# Patient Record
Sex: Male | Born: 1975 | Race: Black or African American | Hispanic: No | Marital: Single | Smoking: Current every day smoker
Health system: Southern US, Community
[De-identification: ages and names within clinical notes are randomized; demographics above are authoritative.]

## PROBLEM LIST (undated history)

## (undated) DIAGNOSIS — Z789 Other specified health status: Secondary | ICD-10-CM

## (undated) HISTORY — PX: KNEE SURGERY: SHX244

## (undated) HISTORY — PX: HEMORRHOID SURGERY: SHX153

---

## 2003-12-03 ENCOUNTER — Observation Stay (HOSPITAL_COMMUNITY): Admission: EM | Admit: 2003-12-03 | Discharge: 2003-12-04 | Payer: Self-pay | Admitting: Emergency Medicine

## 2003-12-05 ENCOUNTER — Emergency Department (HOSPITAL_COMMUNITY): Admission: EM | Admit: 2003-12-05 | Discharge: 2003-12-05 | Payer: Self-pay | Admitting: Emergency Medicine

## 2004-08-29 ENCOUNTER — Emergency Department: Payer: Self-pay | Admitting: Emergency Medicine

## 2004-09-02 ENCOUNTER — Emergency Department: Payer: Self-pay | Admitting: Emergency Medicine

## 2004-09-03 ENCOUNTER — Emergency Department: Payer: Self-pay | Admitting: Emergency Medicine

## 2004-09-10 ENCOUNTER — Emergency Department: Payer: Self-pay | Admitting: Emergency Medicine

## 2005-09-30 ENCOUNTER — Emergency Department (HOSPITAL_COMMUNITY): Admission: EM | Admit: 2005-09-30 | Discharge: 2005-10-01 | Payer: Self-pay | Admitting: Emergency Medicine

## 2006-01-09 ENCOUNTER — Emergency Department (HOSPITAL_COMMUNITY): Admission: EM | Admit: 2006-01-09 | Discharge: 2006-01-09 | Payer: Self-pay | Admitting: Emergency Medicine

## 2006-02-25 ENCOUNTER — Emergency Department (HOSPITAL_COMMUNITY): Admission: EM | Admit: 2006-02-25 | Discharge: 2006-02-26 | Payer: Self-pay | Admitting: Emergency Medicine

## 2007-09-27 ENCOUNTER — Emergency Department: Payer: Self-pay | Admitting: Unknown Physician Specialty

## 2010-05-07 ENCOUNTER — Emergency Department: Payer: Self-pay | Admitting: Emergency Medicine

## 2010-05-16 ENCOUNTER — Emergency Department: Payer: Self-pay | Admitting: Internal Medicine

## 2010-07-26 ENCOUNTER — Emergency Department: Payer: Self-pay | Admitting: Emergency Medicine

## 2010-07-28 ENCOUNTER — Emergency Department: Payer: Self-pay | Admitting: Unknown Physician Specialty

## 2010-08-24 ENCOUNTER — Emergency Department: Payer: Self-pay | Admitting: Emergency Medicine

## 2010-10-30 ENCOUNTER — Emergency Department: Payer: Self-pay | Admitting: Internal Medicine

## 2010-11-27 ENCOUNTER — Emergency Department: Payer: Self-pay | Admitting: Emergency Medicine

## 2011-12-18 ENCOUNTER — Ambulatory Visit: Payer: Self-pay | Admitting: Ophthalmology

## 2019-08-10 ENCOUNTER — Emergency Department
Admission: EM | Admit: 2019-08-10 | Discharge: 2019-08-10 | Disposition: A | Discharge status: Home / Self Care | Payer: Self-pay | Attending: Emergency Medicine | Admitting: Emergency Medicine

## 2019-08-10 ENCOUNTER — Other Ambulatory Visit: Payer: Self-pay

## 2019-08-10 ENCOUNTER — Encounter: Payer: Self-pay | Admitting: *Deleted

## 2019-08-10 DIAGNOSIS — J302 Other seasonal allergic rhinitis: Secondary | ICD-10-CM | POA: Insufficient documentation

## 2019-08-10 DIAGNOSIS — F17200 Nicotine dependence, unspecified, uncomplicated: Secondary | ICD-10-CM | POA: Insufficient documentation

## 2019-08-10 DIAGNOSIS — K649 Unspecified hemorrhoids: Secondary | ICD-10-CM | POA: Insufficient documentation

## 2019-08-10 MED ORDER — FLUTICASONE FUROATE 27.5 MCG/SPRAY NA SUSP: 2.0000 | Freq: Every day | NASAL | 2 refills | Status: DC

## 2019-08-10 MED ORDER — HYDROCORTISONE (PERIANAL) 2.5 % EX CREA: 1.0000 "application " | TOPICAL_CREAM | Freq: Two times a day (BID) | CUTANEOUS | 0 refills | Status: DC

## 2019-08-10 MED ORDER — PSEUDOEPHEDRINE HCL 30 MG PO TABS: 30.0000 mg | ORAL_TABLET | Freq: Four times a day (QID) | ORAL | 2 refills | Status: DC | PRN

## 2019-08-10 NOTE — ED Notes (Addendum)
Pt reports pain 7/10 when sitting due to hemorrhoid, states "size of a golf ball". Reports no bleeding. Pt also reports "really bad allergies" lately and would like this checked out.

## 2019-08-10 NOTE — ED Provider Notes (Signed)
[facility] Emergency Department Provider Note  ____________________________________________  Time seen: Approximately 5:25 PM  I have reviewed the triage vital signs and the nursing notes.   HISTORY  Chief Complaint Hemorrhoids   HPI Scott Smith is a 43 y.o. male presenting to the emergency department for treatment and evaluation of  hemorrhoids and seasonal allergies. Patient states that he has dealt with both for "years."   Hemorrhoids have worsened over the past 2-3 days. He denies bleeding. No relief with suppositories, Epson Salt soak, and witch hazel.   Allergy symptoms have worsened over the past few days as well. No relief with "nose drops" and allergy tablets. He denies fever or cough.  No past medical history on file.  There are no active problems to display for this patient.  Prior to Admission medications   Medication Sig Start Date End Date Taking? Authorizing Provider  fluticasone (VERAMYST) 27.5 MCG/SPRAY nasal spray Place 2 sprays into the nose daily. 08/10/19   Khyson Sebesta, Johnette Abraham B, FNP  hydrocortisone (ANUSOL-HC) 2.5 % rectal cream Place 1 application rectally 2 (two) times daily. 08/10/19   Lenvil Swaim B, FNP  pseudoephedrine (SUDAFED) 30 MG tablet Take 1 tablet (30 mg total) by mouth every 6 (six) hours as needed for congestion. 08/10/19 08/09/20  Victorino Dike, FNP    Allergies Patient has no known allergies.  No family history on file.  Social History Social History   Tobacco Use  . Smoking status: Current Every Day Smoker  . Smokeless tobacco: Never Used  Substance Use Topics  . Alcohol use: Not Currently  . Drug use: Not Currently    Review of Systems  Constitutional: Negative for fever. HEENT: Positive for rhinorrhea, watery eyes, and sneezing Respiratory: Negative for cough or shortness of breath.  Musculoskeletal: Negative for myalgias Skin: Positive for hemorrhoids. Neurological: Negative for numbness or  paresthesias. ____________________________________________   PHYSICAL EXAM:  VITAL SIGNS: ED Triage Vitals  Enc Vitals Group     BP 08/10/19 1715 135/77     Pulse Rate 08/10/19 1715 85     Resp 08/10/19 1715 18     Temp 08/10/19 1715 98.7 F (37.1 C)     Temp Source 08/10/19 1715 Oral     SpO2 08/10/19 1715 97 %     Weight 08/10/19 1716 150 lb (68 kg)     Height 08/10/19 1716 5\' 6"  (1.676 m)     Head Circumference --      Peak Flow --      Pain Score 08/10/19 1715 8     Pain Loc --      Pain Edu? --      Excl. in [place]? --      Constitutional: Well appearing. Eyes: Conjunctivae are clear without discharge or drainage. Nose: Clear rhinorrhea noted. Nasal mucosa injected and bulging Mouth/Throat: Airway is patent.  Neck: No stridor. Unrestricted range of motion observed. Cardiovascular: Capillary refill is <3 seconds.  Respiratory: Respirations are even and unlabored.. Musculoskeletal: Unrestricted range of motion observed. Neurologic: Awake, alert, and oriented x 4.  Skin: Large hemorrhoid without thrombosis or bleeding.  ____________________________________________   LABS (all labs ordered are listed, but only abnormal results are displayed)  Labs Reviewed - No data to display ____________________________________________  EKG  Not indicated. ____________________________________________  RADIOLOGY  Not indicated. ____________________________________________   PROCEDURES  Procedures ____________________________________________   INITIAL IMPRESSION / ASSESSMENT AND PLAN / ED COURSE  Scott Smith is a 43 y.o. male presenting to  the emergency department for treatment and evaluation of seasonal allergies and hemorrhoids. Exam does show a large hemorrhoid, but no thrombosis or infection. He will be treated with Anusol 2.5% and fluticasone plus pseudoephedrine. He is to call Dr. America Brown office to request an appointment for surgical evaluation of hemorrhoids.  He will follow up with PCP or ENT for allergy symptoms not relieve by medications.  Medications - No data to display   Pertinent labs & imaging results that were available during my care of the patient were reviewed by me and considered in my medical decision making (see chart for details).  ____________________________________________   FINAL CLINICAL IMPRESSION(S) / ED DIAGNOSES  Final diagnoses:  Hemorrhoids, unspecified hemorrhoid type  Seasonal allergies    ED Discharge Orders         Ordered    hydrocortisone (ANUSOL-HC) 2.5 % rectal cream  2 times daily     08/10/19 1804    fluticasone (VERAMYST) 27.5 MCG/SPRAY nasal spray  Daily,   Status:  Discontinued     08/10/19 1804    pseudoephedrine (SUDAFED) 30 MG tablet  Every 6 hours PRN     08/10/19 1804    fluticasone (VERAMYST) 27.5 MCG/SPRAY nasal spray  Daily     08/10/19 1809           Note:  This document was prepared using Dragon voice recognition software and may include unintentional dictation errors.   Chinita Pester, FNP 08/10/19 1811    Concha Se, MD 08/11/19 [phone]

## 2019-08-10 NOTE — Discharge Instructions (Signed)
Please call the surgeon's office in the morning to request an appointment.  Take medications as prescribed.  If allergy symptoms do not improve, please see primary care or schedule an appointment with the ENT specialist of your choice.

## 2019-08-10 NOTE — ED Triage Notes (Signed)
Pt reports hemorrhoids that are hanging out.  No bleeding.  No relief with otc meds.  Pt had surgery 2 years ago on them.  Pt alert.

## 2019-08-17 ENCOUNTER — Other Ambulatory Visit: Payer: Self-pay

## 2019-08-17 ENCOUNTER — Ambulatory Visit (INDEPENDENT_AMBULATORY_CARE_PROVIDER_SITE_OTHER): Payer: Self-pay | Admitting: General Surgery

## 2019-08-17 ENCOUNTER — Encounter: Payer: Self-pay | Admitting: General Surgery

## 2019-08-17 VITALS — BP 120/76 | HR 65 | Temp 97.7°F | Resp 14 | Ht 66.0 in | Wt 162.6 lb

## 2019-08-17 DIAGNOSIS — K649 Unspecified hemorrhoids: Secondary | ICD-10-CM

## 2019-08-17 NOTE — Progress Notes (Signed)
Patient ID: Scott Smith, male   DOB: 1976/08/23, 43 y.o.   MRN: [id number]  Chief Complaint  Patient presents with  . Follow-up    Hemorrhoids-seen in ED    HPI Scott Smith TREAT is a 43 y.o. male. He reports a 20-year history of hemorrhoid disease.  He underwent a hemorrhoidectomy in prison in 2018 and was told that the lesion removed was precancerous.  His most recent flare began about 2 weeks ago.  He reports significant pain and irritation in the area.  He notices some blood on his stools as well as bright red blood on toilet tissue.  Is burning and itching associated.  He presented to the emergency department on October 27.  Upon review of the emergency department note, no abscess or infection was identified.  They did not feel that there was any thrombosis present.  He was prescribed Anusol and referred to general surgery for further evaluation.  Mr. Scott Smith states that he is performing sitz bath's and using Epsom salts.  He replies that the Anusol cream is helping.  He denies any family history of colon cancer and has not undergone colonoscopy himself.   History reviewed. No pertinent past medical history.  He denies any history of hypertension, diabetes, asthma, kidney disease, or any other chronic health issues.  Past Surgical History:  Procedure Laterality Date  . HEMORRHOID SURGERY    . KNEE SURGERY Right     History reviewed. No pertinent family history.  Social History Social History   Tobacco Use  . Smoking status: Current Every Day Smoker    Packs/day: 0.50    Years: 25.00    Pack years: 12.50  . Smokeless tobacco: Never Used  Substance Use Topics  . Alcohol use: Not Currently  . Drug use: Not Currently    No Known Allergies  Current Outpatient Medications  Medication Sig Dispense Refill  . fluticasone (VERAMYST) 27.5 MCG/SPRAY nasal spray Place 2 sprays into the nose daily. 10 g 2  . hydrocortisone (ANUSOL-HC) 2.5 % rectal cream Place 1 application rectally  2 (two) times daily. 30 g 0   No current facility-administered medications for this visit.     Review of Systems Review of Systems  All other systems reviewed and are negative.   Blood pressure 120/76, pulse 65, temperature 97.7 F (36.5 C), temperature source Temporal, resp. rate 14, height 5\' 6"  (1.676 m), weight 162 lb 9.6 oz (73.8 kg), SpO2 97 %. Body mass index is 26.24 kg/m.  Physical Exam Physical Exam Vitals signs reviewed. Exam conducted with a chaperone present.  Constitutional:      General: He is not in acute distress.    Appearance: Normal appearance. He is normal weight.  HENT:     Head: Normocephalic and atraumatic.     Comments: He has a scar just lateral to and over the left eye.    Nose: Nose normal.     Comments: Mask keeps slipping off of his nose.    Mouth/Throat:     Comments: Covered with a mask secondary to COVID-19 precautions Eyes:     General: No scleral icterus.       Right eye: No discharge.        Left eye: No discharge.  Neck:     Musculoskeletal: No neck rigidity.  Cardiovascular:     Rate and Rhythm: Normal rate and regular rhythm.     Pulses: Normal pulses.  Pulmonary:     Effort: Pulmonary effort is normal.  Breath sounds: Normal breath sounds.  Abdominal:     General: Abdomen is flat. Bowel sounds are normal.     Palpations: Abdomen is soft.  Genitourinary:    Rectum: External hemorrhoid present.       Comments: There is a large external hemorrhoid in the right lateral column.  It does appear to have potentially have been thrombosed in the past, but is soft and pliable today.  No evidence of abscess or infection. Musculoskeletal:        General: No swelling.     Right lower leg: No edema.     Left lower leg: No edema.  Lymphadenopathy:     Cervical: No cervical adenopathy.  Skin:    General: Skin is warm and dry.     Comments: Multiple tattoos  Neurological:     General: No focal deficit present.     Mental Status: He  is alert and oriented to person, place, and time.  Psychiatric:        Behavior: Behavior normal.     Data Reviewed I reviewed the report from the emergency department.  Pertinent findings are discussed above.  Assessment This is a 43-year-old man who has had hemorrhoids for many years.  He had a recent flare that resulted in an emergency department visit.  Although he is getting some relief from topical therapies, he is interested in surgical removal.  Plan We will schedule Mr. Scott Smith for hemorrhoidectomy.  I discussed the risks of the procedure with him.  These include, but are not limited to, leading, infection, significant postoperative pain, recurrence, and anesthetic complications.  He agreed to accept these risks and we will work on getting him scheduled.  In addition, he has not had a colonoscopy and per report, a previously removed "hemorrhoid" was premalignant.  We will refer him to gastroenterology for evaluation and colonoscopy.    Scott Smith 08/17/2019, 2:52 PM   

## 2019-08-17 NOTE — Patient Instructions (Addendum)
Our surgery scheduler will call you within 24/48 hours to schedule your surgery. Please have the [place] surgery sheet available when speaking with the surgery scheduler.    Referral sent to [facility]. Someone from their office will call to schedule an appointment within 5-7 days. If you do not hear from anyone please call our office so we can check on this for you.    Surgical Procedures for Hemorrhoids, Care After This sheet gives you information about how to care for yourself after your procedure. Your health care provider may also give you more specific instructions. If you have problems or questions, contact your health care provider. What can I expect after the procedure? After the procedure, it is common to have:  Rectal pain.  Pain when you are having a bowel movement.  Slight rectal bleeding. This is more likely to happen with the first bowel movement after surgery. Follow these instructions at home: Medicines  Take over-the-counter and prescription medicines only as told by your health care provider.  If you were prescribed an antibiotic medicine, use it as told by your health care provider. Do not stop using the antibiotic even if your condition improves.  Ask your health care provider if the medicine prescribed to you requires you to avoid driving or using heavy machinery.  Use a stool softener or a bulk laxative as told by your health care provider. Eating and drinking  Follow instructions from your health care provider about what to eat or drink after your procedure.  You may need to take actions to prevent or treat constipation, such as: ? Drink enough fluid to keep your urine pale yellow. ? Take over-the-counter or prescription medicines. ? Eat foods that are high in fiber, such as beans, whole grains, and fresh fruits and vegetables. ? Limit foods that are high in fat and processed sugars, such as fried or sweet foods. Activity   Rest as told by  your health care provider.  Avoid sitting for a long time without moving. Get up to take short walks every 1-2 hours. This is important to improve blood flow and breathing. Ask for help if you feel weak or unsteady.  Return to your normal activities as told by your health care provider. Ask your health care provider what activities are safe for you.  Do not lift anything that is heavier than 10 lb (4.5 kg), or the limit that you are told, until your health care provider says that it is safe.  Do not strain to have a bowel movement.  Do not spend a long time sitting on the toilet. General instructions   Take warm sitz baths for 15-20 minutes, 2-3 times a day to relieve soreness or itching and to keep the rectal area clean.  Apply ice packs to the area to reduce swelling and pain.  Do not drive for 24 hours if you were given a sedative during your procedure.  Keep all follow-up visits as told by your health care provider. This is important. Contact a health care provider if:  Your pain medicine is not helping.  You have a fever or chills.  You have bad smelling drainage.  You have a lot of swelling.  You become constipated.  You have trouble passing urine. Get help right away if:  You have very bad rectal pain.  You have heavy bleeding from your rectum. Summary  After the procedure, it is common to have pain and slight rectal bleeding.  Take warm sitz baths  for 15-20 minutes, 2-3 times a day to relieve soreness or itching and to keep the rectal area clean.  Avoid straining when having a bowel movement.  Eat foods that are high in fiber, such as beans, whole grains, and fresh fruits and vegetables.  Take over-the-counter and prescription medicines only as told by your health care provider. This information is not intended to replace advice given to you by your health care provider. Make sure you discuss any questions you have with your health care provider. Document  Released: 12/21/2003 Document Revised: 08/18/2018 Document Reviewed: 08/18/2018 Elsevier Patient Education  2020 [employer].

## 2019-08-17 NOTE — H&P (View-Only) (Signed)
Patient ID: Scott Smith, male   DOB: 1976/08/23, 43 y.o.   MRN: [id number]  Chief Complaint  Patient presents with  . Follow-up    Hemorrhoids-seen in ED    HPI Scott Smith is a 43 y.o. male. He reports a 20-year history of hemorrhoid disease.  He underwent a hemorrhoidectomy in prison in 2018 and was told that the lesion removed was precancerous.  His most recent flare began about 2 weeks ago.  He reports significant pain and irritation in the area.  He notices some blood on his stools as well as bright red blood on toilet tissue.  Is burning and itching associated.  He presented to the emergency department on October 27.  Upon review of the emergency department note, no abscess or infection was identified.  They did not feel that there was any thrombosis present.  He was prescribed Anusol and referred to general surgery for further evaluation.  Scott Smith states that he is performing sitz bath's and using Epsom salts.  He replies that the Anusol cream is helping.  He denies any family history of colon cancer and has not undergone colonoscopy himself.   History reviewed. No pertinent past medical history.  He denies any history of hypertension, diabetes, asthma, kidney disease, or any other chronic health issues.  Past Surgical History:  Procedure Laterality Date  . HEMORRHOID SURGERY    . KNEE SURGERY Right     History reviewed. No pertinent family history.  Social History Social History   Tobacco Use  . Smoking status: Current Every Day Smoker    Packs/day: 0.50    Years: 25.00    Pack years: 12.50  . Smokeless tobacco: Never Used  Substance Use Topics  . Alcohol use: Not Currently  . Drug use: Not Currently    No Known Allergies  Current Outpatient Medications  Medication Sig Dispense Refill  . fluticasone (VERAMYST) 27.5 MCG/SPRAY nasal spray Place 2 sprays into the nose daily. 10 g 2  . hydrocortisone (ANUSOL-HC) 2.5 % rectal cream Place 1 application rectally  2 (two) times daily. 30 g 0   No current facility-administered medications for this visit.     Review of Systems Review of Systems  All other systems reviewed and are negative.   Blood pressure 120/76, pulse 65, temperature 97.7 F (36.5 C), temperature source Temporal, resp. rate 14, height 5\' 6"  (1.676 m), weight 162 lb 9.6 oz (73.8 kg), SpO2 97 %. Body mass index is 26.24 kg/m.  Physical Exam Physical Exam Vitals signs reviewed. Exam conducted with a chaperone present.  Constitutional:      General: He is not in acute distress.    Appearance: Normal appearance. He is normal weight.  HENT:     Head: Normocephalic and atraumatic.     Comments: He has a scar just lateral to and over the left eye.    Nose: Nose normal.     Comments: Mask keeps slipping off of his nose.    Mouth/Throat:     Comments: Covered with a mask secondary to COVID-19 precautions Eyes:     General: No scleral icterus.       Right eye: No discharge.        Left eye: No discharge.  Neck:     Musculoskeletal: No neck rigidity.  Cardiovascular:     Rate and Rhythm: Normal rate and regular rhythm.     Pulses: Normal pulses.  Pulmonary:     Effort: Pulmonary effort is normal.  Breath sounds: Normal breath sounds.  Abdominal:     General: Abdomen is flat. Bowel sounds are normal.     Palpations: Abdomen is soft.  Genitourinary:    Rectum: External hemorrhoid present.       Comments: There is a large external hemorrhoid in the right lateral column.  It does appear to have potentially have been thrombosed in the past, but is soft and pliable today.  No evidence of abscess or infection. Musculoskeletal:        General: No swelling.     Right lower leg: No edema.     Left lower leg: No edema.  Lymphadenopathy:     Cervical: No cervical adenopathy.  Skin:    General: Skin is warm and dry.     Comments: Multiple tattoos  Neurological:     General: No focal deficit present.     Mental Status: He  is alert and oriented to person, place, and time.  Psychiatric:        Behavior: Behavior normal.     Data Reviewed I reviewed the report from the emergency department.  Pertinent findings are discussed above.  Assessment This is a 44 year old man who has had hemorrhoids for many years.  He had a recent flare that resulted in an emergency department visit.  Although he is getting some relief from topical therapies, he is interested in surgical removal.  Plan We will schedule Scott Smith for hemorrhoidectomy.  I discussed the risks of the procedure with him.  These include, but are not limited to, leading, infection, significant postoperative pain, recurrence, and anesthetic complications.  He agreed to accept these risks and we will work on getting him scheduled.  In addition, he has not had a colonoscopy and per report, a previously removed "hemorrhoid" was premalignant.  We will refer him to gastroenterology for evaluation and colonoscopy.    Scott Smith 08/17/2019, 2:52 PM

## 2019-08-18 ENCOUNTER — Telehealth: Payer: Self-pay

## 2019-08-18 NOTE — Telephone Encounter (Signed)
Pt has been advised of pre admission date/time, Covid Testing date and Surgery date.  Surgery Date:08/27/19 Preadmission Testing Date: Phone Covid Testing Date: 08/24/19 - patient advised to go to the [facility] ([place]) 8-10:30am  Emmi Video sent via [facility] and [employer].  Patient has been made aware to call [phone], between 1-3:00pm the day before surgery(08/26/19), to find out what time to arrive.

## 2019-08-18 NOTE — Telephone Encounter (Signed)
Error

## 2019-08-23 ENCOUNTER — Encounter
Admission: RE | Admit: 2019-08-23 | Discharge: 2019-08-23 | Disposition: A | Discharge status: Home / Self Care | Payer: Self-pay | Source: Ambulatory Visit | Attending: General Surgery | Admitting: General Surgery

## 2019-08-23 ENCOUNTER — Other Ambulatory Visit: Payer: Self-pay | Admitting: General Surgery

## 2019-08-23 DIAGNOSIS — K649 Unspecified hemorrhoids: Secondary | ICD-10-CM

## 2019-08-23 DIAGNOSIS — Z01818 Encounter for other preprocedural examination: Secondary | ICD-10-CM | POA: Insufficient documentation

## 2019-08-23 HISTORY — DX: Other specified health status: Z78.9

## 2019-08-23 NOTE — Patient Instructions (Signed)
Your procedure is scheduled on: Friday 11/13 Report to Day Surgery. To find out your arrival time please call [phone] between 1PM - 3PM on Thurs 11/12.  Remember: Instructions that are not followed completely may result in serious medical risk,  up to and including death, or upon the discretion of your surgeon and anesthesiologist your  surgery may need to be rescheduled.     _X__ 1. Do not eat food after midnight the night before your procedure.                 No gum chewing or hard candies. You may drink clear liquids up to 2 hours                 before you are scheduled to arrive for your surgery- DO not drink clear                 liquids within 2 hours of the start of your surgery.                 Clear Liquids include:  water, apple juice without pulp, clear carbohydrate                 drink such as Clearfast of Gatorade, Black Coffee or Tea (Do not add                 anything to coffee or tea).  __X__2.  On the morning of surgery brush your teeth with toothpaste and water, you                may rinse your mouth with mouthwash if you wish.  Do not swallow any toothpaste of mouthwash.     _X__ 3.  No Alcohol for 24 hours before or after surgery.   _X__ 4.  Do Not Smoke or use e-cigarettes For 24 Hours Prior to Your Surgery.                 Do not use any chewable tobacco products for at least 6 hours prior to                 surgery.  ____  5.  Bring all medications with you on the day of surgery if instructed.   __x__  6.  Notify your doctor if there is any change in your medical condition      (cold, fever, infections).     Do not wear jewelry, make-up, hairpins, clips or nail polish. Do not wear lotions, powders, or perfumes. You may wear deodorant. Do not shave 48 hours prior to surgery. Men may shave face and neck. Do not bring valuables to the hospital.    [facility] is not responsible for any belongings or valuables.  Contacts,  dentures or bridgework may not be worn into surgery. Leave your suitcase in the car. After surgery it may be brought to your room. For patients admitted to the hospital, discharge time is determined by your treatment team.   Patients discharged the day of surgery will not be allowed to drive home.   Please read over the following fact sheets that you were given:     _x___ Take these medicines the morning of surgery with A SIP OF WATER:    1.fluticasone (FLONASE) 50 MCG/ACT nasal spray  2. loratadine (CLARITIN) 10 MG tablet  3.   4.  5.  6.  __x__ Fleet Enema (as directed)   _x___ Use CHG Soap as  directed  ____ Use inhalers on the day of surgery  ____ Stop metformin 2 days prior to surgery    ____ Take 1/2 of usual insulin dose the night before surgery. No insulin the morning          of surgery.   ____ Stop Coumadin/Plavix/aspirin on   _x___ Stop Anti-inflammatories ibuprofen, aleve or aspirin until after surgery   ____ Stop supplements until after surgery.    ____ Bring C-Pap to the hospital.

## 2019-08-24 ENCOUNTER — Other Ambulatory Visit
Admission: RE | Admit: 2019-08-24 | Discharge: 2019-08-24 | Disposition: A | Discharge status: Home / Self Care | Payer: Self-pay | Source: Ambulatory Visit | Attending: General Surgery | Admitting: General Surgery

## 2019-08-24 DIAGNOSIS — Z01812 Encounter for preprocedural laboratory examination: Secondary | ICD-10-CM | POA: Insufficient documentation

## 2019-08-24 DIAGNOSIS — Z20828 Contact with and (suspected) exposure to other viral communicable diseases: Secondary | ICD-10-CM | POA: Insufficient documentation

## 2019-08-24 LAB — SARS CORONAVIRUS 2 (TAT 6-24 HRS): SARS Coronavirus 2: NEGATIVE

## 2019-08-27 ENCOUNTER — Ambulatory Visit: Payer: Self-pay | Admitting: Anesthesiology

## 2019-08-27 ENCOUNTER — Encounter
Admission: RE | Disposition: A | Discharge status: Home / Self Care | Payer: Self-pay | Source: Home / Self Care | Attending: General Surgery

## 2019-08-27 ENCOUNTER — Other Ambulatory Visit: Payer: Self-pay

## 2019-08-27 ENCOUNTER — Encounter: Payer: Self-pay | Admitting: Anesthesiology

## 2019-08-27 ENCOUNTER — Ambulatory Visit
Admission: RE | Admit: 2019-08-27 | Discharge: 2019-08-27 | Disposition: A | Discharge status: Home / Self Care | Payer: Self-pay | Attending: General Surgery | Admitting: General Surgery

## 2019-08-27 DIAGNOSIS — K643 Fourth degree hemorrhoids: Secondary | ICD-10-CM | POA: Insufficient documentation

## 2019-08-27 DIAGNOSIS — K644 Residual hemorrhoidal skin tags: Secondary | ICD-10-CM | POA: Insufficient documentation

## 2019-08-27 DIAGNOSIS — K219 Gastro-esophageal reflux disease without esophagitis: Secondary | ICD-10-CM | POA: Insufficient documentation

## 2019-08-27 DIAGNOSIS — Z79899 Other long term (current) drug therapy: Secondary | ICD-10-CM | POA: Insufficient documentation

## 2019-08-27 DIAGNOSIS — K649 Unspecified hemorrhoids: Secondary | ICD-10-CM

## 2019-08-27 DIAGNOSIS — F17210 Nicotine dependence, cigarettes, uncomplicated: Secondary | ICD-10-CM | POA: Insufficient documentation

## 2019-08-27 HISTORY — PX: HEMORRHOID SURGERY: SHX153

## 2019-08-27 SURGERY — HEMORRHOIDECTOMY
Anesthesia: General

## 2019-08-27 MED ORDER — OXYCODONE HCL 5 MG PO TABS: 5.0000 mg | ORAL_TABLET | Freq: Four times a day (QID) | ORAL | 0 refills | Status: DC | PRN

## 2019-08-27 MED ORDER — CHLORHEXIDINE GLUCONATE CLOTH 2 % EX PADS: 6.0000 | MEDICATED_PAD | Freq: Once | CUTANEOUS | Status: DC

## 2019-08-27 MED ORDER — DEXAMETHASONE SODIUM PHOSPHATE 10 MG/ML IJ SOLN
INTRAMUSCULAR | Status: DC | PRN
  Administered 2019-08-27: 10 mg via INTRAVENOUS

## 2019-08-27 MED ORDER — CELECOXIB 200 MG PO CAPS
ORAL_CAPSULE | ORAL | Status: AC
  Administered 2019-08-27: 200 mg via ORAL
  Filled 2019-08-27: qty 1

## 2019-08-27 MED ORDER — DEXMEDETOMIDINE HCL 200 MCG/2ML IV SOLN
INTRAVENOUS | Status: DC | PRN
  Administered 2019-08-27: 10 ug via INTRAVENOUS

## 2019-08-27 MED ORDER — BUPIVACAINE LIPOSOME 1.3 % IJ SUSP: 20.0000 mL | Freq: Once | INTRAMUSCULAR | Status: DC

## 2019-08-27 MED ORDER — LIDOCAINE HCL (CARDIAC) PF 100 MG/5ML IV SOSY
PREFILLED_SYRINGE | INTRAVENOUS | Status: DC | PRN
  Administered 2019-08-27: 100 mg via INTRAVENOUS

## 2019-08-27 MED ORDER — FENTANYL CITRATE (PF) 100 MCG/2ML IJ SOLN: 25.0000 ug | INTRAMUSCULAR | Status: DC | PRN

## 2019-08-27 MED ORDER — ACETAMINOPHEN 500 MG PO TABS
ORAL_TABLET | ORAL | Status: AC
  Administered 2019-08-27: 1000 mg via ORAL
  Filled 2019-08-27: qty 2

## 2019-08-27 MED ORDER — FAMOTIDINE 20 MG PO TABS
20.0000 mg | ORAL_TABLET | Freq: Once | ORAL | Status: AC
  Administered 2019-08-27: 11:00:00 20 mg via ORAL

## 2019-08-27 MED ORDER — FLEET ENEMA 7-19 GM/118ML RE ENEM: 1.0000 | ENEMA | Freq: Once | RECTAL | Status: DC

## 2019-08-27 MED ORDER — PROPOFOL 10 MG/ML IV BOLUS
INTRAVENOUS | Status: DC | PRN
  Administered 2019-08-27: 120 mg via INTRAVENOUS

## 2019-08-27 MED ORDER — DOCUSATE SODIUM 100 MG PO CAPS: 100.0000 mg | ORAL_CAPSULE | Freq: Two times a day (BID) | ORAL | 0 refills | Status: AC

## 2019-08-27 MED ORDER — IBUPROFEN 800 MG PO TABS: 800.0000 mg | ORAL_TABLET | Freq: Three times a day (TID) | ORAL | 0 refills | Status: DC | PRN

## 2019-08-27 MED ORDER — ONDANSETRON HCL 4 MG/2ML IJ SOLN: 4.0000 mg | Freq: Once | INTRAMUSCULAR | Status: DC | PRN

## 2019-08-27 MED ORDER — ACETAMINOPHEN 500 MG PO TABS: 1000.0000 mg | ORAL_TABLET | Freq: Four times a day (QID) | ORAL | 2 refills | Status: AC

## 2019-08-27 MED ORDER — LACTATED RINGERS IV SOLN
INTRAVENOUS | Status: DC
  Administered 2019-08-27 (×2): via INTRAVENOUS

## 2019-08-27 MED ORDER — OXYCODONE HCL 5 MG PO TABS
5.0000 mg | ORAL_TABLET | Freq: Once | ORAL | Status: DC
  Filled 2019-08-27: qty 1

## 2019-08-27 MED ORDER — FENTANYL CITRATE (PF) 100 MCG/2ML IJ SOLN
INTRAMUSCULAR | Status: DC | PRN
  Administered 2019-08-27 (×2): 50 ug via INTRAVENOUS

## 2019-08-27 MED ORDER — SUGAMMADEX SODIUM 500 MG/5ML IV SOLN
INTRAVENOUS | Status: DC | PRN
  Administered 2019-08-27: 200 mg via INTRAVENOUS

## 2019-08-27 MED ORDER — FAMOTIDINE 20 MG PO TABS
ORAL_TABLET | ORAL | Status: AC
  Administered 2019-08-27: 20 mg via ORAL
  Filled 2019-08-27: qty 1

## 2019-08-27 MED ORDER — GABAPENTIN 300 MG PO CAPS
300.0000 mg | ORAL_CAPSULE | ORAL | Status: AC
  Administered 2019-08-27: 11:00:00 300 mg via ORAL

## 2019-08-27 MED ORDER — NEOSTIGMINE METHYLSULFATE 10 MG/10ML IV SOLN: INTRAVENOUS | Status: DC | PRN

## 2019-08-27 MED ORDER — FLEET ENEMA 7-19 GM/118ML RE ENEM
1.0000 | ENEMA | Freq: Once | RECTAL | Status: DC
  Filled 2019-08-27: qty 1

## 2019-08-27 MED ORDER — ACETAMINOPHEN 500 MG PO TABS
1000.0000 mg | ORAL_TABLET | ORAL | Status: AC
  Administered 2019-08-27: 11:00:00 1000 mg via ORAL

## 2019-08-27 MED ORDER — ROCURONIUM BROMIDE 100 MG/10ML IV SOLN
INTRAVENOUS | Status: DC | PRN
  Administered 2019-08-27: 10 mg via INTRAVENOUS
  Administered 2019-08-27: 20 mg via INTRAVENOUS

## 2019-08-27 MED ORDER — CELECOXIB 200 MG PO CAPS
200.0000 mg | ORAL_CAPSULE | ORAL | Status: AC
  Administered 2019-08-27: 11:00:00 200 mg via ORAL

## 2019-08-27 MED ORDER — SUCCINYLCHOLINE CHLORIDE 20 MG/ML IJ SOLN
INTRAMUSCULAR | Status: DC | PRN
  Administered 2019-08-27: 100 mg via INTRAVENOUS

## 2019-08-27 MED ORDER — GABAPENTIN 300 MG PO CAPS
ORAL_CAPSULE | ORAL | Status: AC
  Administered 2019-08-27: 300 mg via ORAL
  Filled 2019-08-27: qty 1

## 2019-08-27 MED ORDER — BUPIVACAINE LIPOSOME 1.3 % IJ SUSP
INTRAMUSCULAR | Status: DC | PRN
  Administered 2019-08-27: 20 mL

## 2019-08-27 MED ORDER — ONDANSETRON HCL 4 MG/2ML IJ SOLN
INTRAMUSCULAR | Status: DC | PRN
  Administered 2019-08-27: 4 mg via INTRAVENOUS

## 2019-08-27 SURGICAL SUPPLY — 30 items
BLADE CLIPPER SURG (BLADE) ×2 IMPLANT
BLADE SURG 15 STRL LF DISP TIS (BLADE) ×1 IMPLANT
BLADE SURG 15 STRL SS (BLADE) ×1
BRIEF STRETCH MATERNITY 2XLG (MISCELLANEOUS) ×2 IMPLANT
CANISTER SUCT 1200ML W/VALVE (MISCELLANEOUS) ×2 IMPLANT
COVER WAND RF STERILE (DRAPES) ×2 IMPLANT
DRAPE LAPAROTOMY 77X122 PED (DRAPES) ×2 IMPLANT
DRSG GAUZE FLUFF 36X18 (GAUZE/BANDAGES/DRESSINGS) ×2 IMPLANT
ELECT CAUTERY BLADE TIP 2.5 (TIP) ×2
ELECT REM PT RETURN 9FT ADLT (ELECTROSURGICAL) ×2
ELECTRODE CAUTERY BLDE TIP 2.5 (TIP) ×1 IMPLANT
ELECTRODE REM PT RTRN 9FT ADLT (ELECTROSURGICAL) ×1 IMPLANT
GLOVE BIO SURGEON STRL SZ 6.5 (GLOVE) ×6 IMPLANT
GLOVE INDICATOR 7.0 STRL GRN (GLOVE) ×6 IMPLANT
GOWN STRL REUS W/ TWL LRG LVL3 (GOWN DISPOSABLE) ×3 IMPLANT
GOWN STRL REUS W/TWL LRG LVL3 (GOWN DISPOSABLE) ×3
KIT TURNOVER KIT A (KITS) ×2 IMPLANT
LABEL OR SOLS (LABEL) ×2 IMPLANT
NEEDLE HYPO 25X1 1.5 SAFETY (NEEDLE) ×2 IMPLANT
PACK BASIN MINOR ARMC (MISCELLANEOUS) ×2 IMPLANT
SHEARS HARMONIC 9CM CVD (BLADE) ×2 IMPLANT
SOL PREP PVP 2OZ (MISCELLANEOUS) ×2
SOLUTION PREP PVP 2OZ (MISCELLANEOUS) ×1 IMPLANT
SPONGE LAP 18X18 RF (DISPOSABLE) ×2 IMPLANT
SURGILUBE 2OZ TUBE FLIPTOP (MISCELLANEOUS) ×2 IMPLANT
SUT SILK 0 CT 1 30 (SUTURE) IMPLANT
SUT VIC AB 3-0 SH 27 (SUTURE)
SUT VIC AB 3-0 SH 27X BRD (SUTURE) IMPLANT
SYR 10ML LL (SYRINGE) ×2 IMPLANT
SYR BULB IRRIG 60ML STRL (SYRINGE) ×2 IMPLANT

## 2019-08-27 NOTE — Op Note (Signed)
Operative Note  Preoperative Diagnosis: Grade 4 internal and external hemorrhoids  Postoperative Diagnosis: Same  Operation: 2 column hemorrhoidectomy, internal and external, open  Surgeon: Fredirick Maudlin, MD  Assistant: None  Anesthesia: General endotracheal  Findings: The patient had prolapsed internal and external hemorrhoids in the left lateral and posterior columns.  There was evidence of recent bleeding on the left lateral column.  Indications: This is a 43 year old man who has a history of hemorrhoids, status post hemorrhoidectomy.  He has had recurrence with increasing symptoms of discomfort and bleeding.  He desired surgical hemorrhoidectomy.  The risks of the procedure were discussed with him and he agreed to proceed.  Procedure In Detail: The patient was identified in the preoperative holding area and brought to the operating room.  He was intubated on the stretcher and then rolled into the prone position on the operating table.  His body was supported on a purpose-made chest roll.  His arms were supported on arm boards.  Care was taken to appropriately pad all bony prominences and avoid pressure on the genitals.  The table was then flexed into a prone jackknife position.  The buttocks were taped laterally to provide exposure.  He was then prepped and draped in standard fashion.  A timeout was performed confirming the patient's identity, the procedure being performed, his allergies, and that all necessary equipment was available.  A lubricated Hill-Ferguson retractor was inserted into the anus.  The left lateral column of hemorrhoids was grasped with an Allis clamp.  The harmonic scalpel was used to dissect the hemorrhoidal vessels and associated tissue, taking care to remain submucosal and avoid violation of the sphincter muscles.  The column was transected and handed off as a specimen.  This procedure was repeated with a posterior column of hemorrhoids.  We then irrigated the anal  canal and confirmed good hemostasis, using electrocautery on a small area of oozing.  Liposomal bupivacaine was then infiltrated around the entire surgical site.  Gauze fluffs and mesh underpants were used as a dressing.  The patient was turned supine onto the stretcher.  He was awakened, extubated, and taken to the postanesthesia care unit in good condition.  EBL: Less than 10 cc  IVF: See anesthesia record  Specimen(s): Left lateral and posterior column hemorrhoids to pathology.  Complications: none immediately apparent.   Counts: all needles, instruments, and sponges were counted and reported to be correct in number at the end of the case.   I was present for and participated in the entire operation.  Fredirick Maudlin 12:43 PM

## 2019-08-27 NOTE — Anesthesia Preprocedure Evaluation (Addendum)
Anesthesia Evaluation  Patient identified by MRN, date of birth, ID band Patient awake    Reviewed: Allergy & Precautions, NPO status , Patient's Chart, lab work & pertinent test results  Airway Mallampati: II       Dental   Pulmonary Current Smoker and Patient abstained from smoking.,    Pulmonary exam normal        Cardiovascular negative cardio ROS Normal cardiovascular exam     Neuro/Psych negative neurological ROS  negative psych ROS   GI/Hepatic Neg liver ROS, GERD  Poorly Controlled,  Endo/Other  negative endocrine ROS  Renal/GU negative Renal ROS  negative genitourinary   Musculoskeletal negative musculoskeletal ROS (+)   Abdominal Normal abdominal exam  (+)   Peds negative pediatric ROS (+)  Hematology negative hematology ROS (+)   Anesthesia Other Findings Past Medical History: No date: Medical history non-contributory  Reproductive/Obstetrics negative OB ROS                           Anesthesia Physical Anesthesia Plan  ASA: II  Anesthesia Plan: General   Post-op Pain Management:    Induction: Intravenous, Rapid sequence and Cricoid pressure planned  PONV Risk Score and Plan:   Airway Management Planned: Oral ETT  Additional Equipment:   Intra-op Plan:   Post-operative Plan:   Informed Consent: I have reviewed the patients History and Physical, chart, labs and discussed the procedure including the risks, benefits and alternatives for the proposed anesthesia with the patient or authorized representative who has indicated his/her understanding and acceptance.     Dental advisory given  Plan Discussed with: CRNA and Surgeon  Anesthesia Plan Comments:        Anesthesia Quick Evaluation

## 2019-08-27 NOTE — Discharge Instructions (Signed)
AMBULATORY SURGERY  DISCHARGE INSTRUCTIONS   1) The drugs that you were given will stay in your system until tomorrow so for the next 24 hours you should not:  A) Drive an automobile B) Make any legal decisions C) Drink any alcoholic beverage   2) You may resume regular meals tomorrow.  Today it is better to start with liquids and gradually work up to solid foods.  You may eat anything you prefer, but it is better to start with liquids, then soup and crackers, and gradually work up to solid foods.   3) Please notify your doctor immediately if you have any unusual bleeding, trouble breathing, redness and pain at the surgery site, drainage, fever, or pain not relieved by medication.    4) Additional Instructions:        Please contact your physician with any problems or Same Day Surgery at [phone], Monday through Friday 6 am to 4 pm, or [place] at [facility] number at [phone].Surgical Procedures for Hemorrhoids, Care After This sheet gives you information about how to care for yourself after your procedure. Your health care provider may also give you more specific instructions. If you have problems or questions, contact your health care provider. What can I expect after the procedure? After the procedure, it is common to have:  Rectal pain.  Pain when you are having a bowel movement.  Slight rectal bleeding. This is more likely to happen with the first bowel movement after surgery. Follow these instructions at home: Medicines  Take over-the-counter and prescription medicines only as told by your health care provider.  If you were prescribed an antibiotic medicine, use it as told by your health care provider. Do not stop using the antibiotic even if your condition improves.  Ask your health care provider if the medicine prescribed to you requires you to avoid driving or using heavy machinery.  Use a stool softener or a bulk laxative as told by your health  care provider. Eating and drinking  Follow instructions from your health care provider about what to eat or drink after your procedure.  You may need to take actions to prevent or treat constipation, such as: ? Drink enough fluid to keep your urine pale yellow. ? Take over-the-counter or prescription medicines. ? Eat foods that are high in fiber, such as beans, whole grains, and fresh fruits and vegetables. ? Limit foods that are high in fat and processed sugars, such as fried or sweet foods. Activity   Rest as told by your health care provider.  Avoid sitting for a long time without moving. Get up to take short walks every 1-2 hours. This is important to improve blood flow and breathing. Ask for help if you feel weak or unsteady.  Return to your normal activities as told by your health care provider. Ask your health care provider what activities are safe for you.  Do not lift anything that is heavier than 10 lb (4.5 kg), or the limit that you are told, until your health care provider says that it is safe.  Do not strain to have a bowel movement.  Do not spend a long time sitting on the toilet. General instructions   Take warm sitz baths for 15-20 minutes, 2-3 times a day to relieve soreness or itching and to keep the rectal area clean.  Apply ice packs to the area to reduce swelling and pain.  Do not drive for 24 hours if you were given a sedative during  your procedure.  Keep all follow-up visits as told by your health care provider. This is important. Contact a health care provider if:  Your pain medicine is not helping.  You have a fever or chills.  You have bad smelling drainage.  You have a lot of swelling.  You become constipated.  You have trouble passing urine. Get help right away if:  You have very bad rectal pain.  You have heavy bleeding from your rectum. Summary  After the procedure, it is common to have pain and slight rectal bleeding.  Take warm  sitz baths for 15-20 minutes, 2-3 times a day to relieve soreness or itching and to keep the rectal area clean.  Avoid straining when having a bowel movement.  Eat foods that are high in fiber, such as beans, whole grains, and fresh fruits and vegetables.  Take over-the-counter and prescription medicines only as told by your health care provider. This information is not intended to replace advice given to you by your health care provider. Make sure you discuss any questions you have with your health care provider. Document Released: 12/21/2003 Document Revised: 08/18/2018 Document Reviewed: 08/18/2018 Elsevier Patient Education  2020 [employer]   5) The drugs that you were given will stay in your system until tomorrow so for the next 24 hours you should not:  D) Drive an automobile E) Make any legal decisions F) Drink any alcoholic beverage   6) You may resume regular meals tomorrow.  Today it is better to start with liquids and gradually work up to solid foods.  You may eat anything you prefer, but it is better to start with liquids, then soup and crackers, and gradually work up to solid foods.   7) Please notify your doctor immediately if you have any unusual bleeding, trouble breathing, redness and pain at the surgery site, drainage, fever, or pain not relieved by medication.    8) Additional Instructions:        Please contact your physician with any problems or Same Day Surgery at [phone], Monday through Friday 6 am to 4 pm, or [place] at [facility] number at [phone].

## 2019-08-27 NOTE — Interval H&P Note (Signed)
History and Physical Interval Note:  08/27/2019 11:02 AM  Scott Smith  has presented today for surgery, with the diagnosis of external hemorrhoids.  The various methods of treatment have been discussed with the patient and family. After consideration of risks, benefits and other options for treatment, the patient has consented to  Procedure(s): HEMORRHOIDECTOMY (N/A) as a surgical intervention.  The patient's history has been reviewed, patient examined, no change in status, stable for surgery.  I have reviewed the patient's chart and labs.  Questions were answered to the patient's satisfaction.     Fredirick Maudlin

## 2019-08-27 NOTE — Transfer of Care (Signed)
Immediate Anesthesia Transfer of Care Note  Patient: Scott Smith  Procedure(s) Performed: HEMORRHOIDECTOMY (N/A )  Patient Location: PACU  Anesthesia Type:General  Level of Consciousness: sedated  Airway & Oxygen Therapy: Patient Spontanous Breathing and Patient connected to face mask oxygen  Post-op Assessment: Report given to RN and Post -op Vital signs reviewed and stable  Post vital signs: Reviewed and stable  Last Vitals:  Vitals Value Taken Time  BP 101/58 08/27/19 1236  Temp    Pulse 64 08/27/19 1237  Resp 17 08/27/19 1237  SpO2 99 % 08/27/19 1237  Vitals shown include unvalidated device data.  Last Pain:  Vitals:   08/27/19 1042  TempSrc: Temporal  PainSc: 0-No pain         Complications: No apparent anesthesia complications

## 2019-08-27 NOTE — Anesthesia Post-op Follow-up Note (Signed)
Anesthesia QCDR form completed.        

## 2019-08-27 NOTE — Anesthesia Procedure Notes (Signed)
Procedure Name: Intubation Date/Time: 08/27/2019 11:41 AM Performed by: Justus Memory, CRNA Pre-anesthesia Checklist: Patient identified, Patient being monitored, Timeout performed, Emergency Drugs available and Suction available Patient Re-evaluated:Patient Re-evaluated prior to induction Oxygen Delivery Method: Circle system utilized Preoxygenation: Pre-oxygenation with 100% oxygen Induction Type: IV induction Ventilation: Mask ventilation without difficulty Laryngoscope Size: Mac, 3 and McGraph Grade View: Grade I Tube type: Oral Tube size: 7.0 mm Number of attempts: 1 Airway Equipment and Method: Stylet and Video-laryngoscopy Placement Confirmation: ETT inserted through vocal cords under direct vision,  positive ETCO2 and breath sounds checked- equal and bilateral Secured at: 21 cm Tube secured with: Tape Dental Injury: Teeth and Oropharynx as per pre-operative assessment

## 2019-08-30 ENCOUNTER — Encounter: Payer: Self-pay | Admitting: General Surgery

## 2019-08-30 LAB — SURGICAL PATHOLOGY

## 2019-08-30 NOTE — Progress Notes (Signed)
Pt complained of constipation. Reiterated bowel hygiene and encouraged patient to use OTC stool softeners to prevent further constipation. Pt verbalized understanding. Educated to call office if symptoms persist

## 2019-08-30 NOTE — Anesthesia Postprocedure Evaluation (Signed)
Anesthesia Post Note  Patient: Scott Smith  Procedure(s) Performed: HEMORRHOIDECTOMY (N/A )  Patient location during evaluation: PACU Anesthesia Type: General Level of consciousness: awake and alert and oriented Pain management: pain level controlled Vital Signs Assessment: post-procedure vital signs reviewed and stable Respiratory status: spontaneous breathing Cardiovascular status: blood pressure returned to baseline Anesthetic complications: no     Last Vitals:  Vitals:   08/27/19 1354 08/27/19 1500  BP: (!) 140/91 139/84  Pulse: 66 88  Resp: 20 (!) 22  Temp: (!) 36.3 C   SpO2: 100% 100%    Last Pain:  Vitals:   08/30/19 0842  TempSrc:   PainSc: 0-No pain                 Mariaclara Spear

## 2019-09-03 ENCOUNTER — Other Ambulatory Visit: Payer: Self-pay

## 2019-09-03 ENCOUNTER — Emergency Department: Payer: Self-pay

## 2019-09-03 DIAGNOSIS — K5900 Constipation, unspecified: Secondary | ICD-10-CM | POA: Insufficient documentation

## 2019-09-03 DIAGNOSIS — F17200 Nicotine dependence, unspecified, uncomplicated: Secondary | ICD-10-CM | POA: Insufficient documentation

## 2019-09-03 DIAGNOSIS — Z79899 Other long term (current) drug therapy: Secondary | ICD-10-CM | POA: Insufficient documentation

## 2019-09-03 LAB — COMPREHENSIVE METABOLIC PANEL
ALT: 15 U/L (ref 0–44)
AST: 16 U/L (ref 15–41)
Albumin: 4.7 g/dL (ref 3.5–5.0)
Alkaline Phosphatase: 52 U/L (ref 38–126)
Anion gap: 13 (ref 5–15)
BUN: 11 mg/dL (ref 6–20)
CO2: 23 mmol/L (ref 22–32)
Calcium: 9.1 mg/dL (ref 8.9–10.3)
Chloride: 100 mmol/L (ref 98–111)
Creatinine, Ser: 1 mg/dL (ref 0.61–1.24)
GFR calc Af Amer: >60 mL/min (ref >60)
GFR calc non Af Amer: >60 mL/min (ref >60)
Glucose, Bld: 96 mg/dL (ref 70–99)
Potassium: 3.8 mmol/L (ref 3.5–5.1)
Sodium: 136 mmol/L (ref 135–145)
Total Bilirubin: 1 mg/dL (ref 0.3–1.2)
Total Protein: 8.5 g/dL — ABNORMAL HIGH (ref 6.5–8.1)

## 2019-09-03 LAB — CBC WITH DIFFERENTIAL/PLATELET
Abs Immature Granulocytes: 0.05 10*3/uL (ref 0.00–0.07)
Basophils Absolute: 0 10*3/uL (ref 0.0–0.1)
Basophils Relative: 0 %
Eosinophils Absolute: 0.1 10*3/uL (ref 0.0–0.5)
Eosinophils Relative: 1 %
HCT: 46.3 % (ref 39.0–52.0)
Hemoglobin: 15.7 g/dL (ref 13.0–17.0)
Immature Granulocytes: 1 %
Lymphocytes Relative: 24 %
Lymphs Abs: 2.3 10*3/uL (ref 0.7–4.0)
MCH: 30.1 pg (ref 26.0–34.0)
MCHC: 33.9 g/dL (ref 30.0–36.0)
MCV: 88.9 fL (ref 80.0–100.0)
Monocytes Absolute: 1 10*3/uL (ref 0.1–1.0)
Monocytes Relative: 10 %
Neutro Abs: 6.2 10*3/uL (ref 1.7–7.7)
Neutrophils Relative %: 64 %
Platelets: 255 10*3/uL (ref 150–400)
RBC: 5.21 MIL/uL (ref 4.22–5.81)
RDW: 12.8 % (ref 11.5–15.5)
WBC: 9.6 10*3/uL (ref 4.0–10.5)
nRBC: 0 % (ref 0.0–0.2)

## 2019-09-03 MED ORDER — IOHEXOL 9 MG/ML PO SOLN: 500.0000 mL | ORAL | Status: AC

## 2019-09-03 NOTE — ED Triage Notes (Signed)
Patient reports had surgery on hemorrhoids a week ago, has not had a bowel movement since.  Reports he has called and tried all they recommended without any results.

## 2019-09-04 ENCOUNTER — Emergency Department
Admission: EM | Admit: 2019-09-04 | Discharge: 2019-09-04 | Disposition: A | Discharge status: Home / Self Care | Payer: Self-pay | Attending: Emergency Medicine | Admitting: Emergency Medicine

## 2019-09-04 ENCOUNTER — Emergency Department: Payer: Self-pay

## 2019-09-04 DIAGNOSIS — K5900 Constipation, unspecified: Secondary | ICD-10-CM

## 2019-09-04 LAB — TYPE AND SCREEN
ABO/RH(D): A POS
Antibody Screen: NEGATIVE

## 2019-09-04 MED ORDER — LACTULOSE 10 GM/15ML PO SOLN: 20.0000 g | Freq: Every day | ORAL | 0 refills | Status: AC | PRN

## 2019-09-04 MED ORDER — IOHEXOL 300 MG/ML  SOLN
100.0000 mL | Freq: Once | INTRAMUSCULAR | Status: AC | PRN
  Administered 2019-09-04: 100 mL via INTRAVENOUS

## 2019-09-04 MED ORDER — LIDOCAINE HCL URETHRAL/MUCOSAL 2 % EX GEL
1.0000 "application " | Freq: Once | CUTANEOUS | Status: AC
  Administered 2019-09-04: 1 via TOPICAL
  Filled 2019-09-04: qty 10

## 2019-09-04 NOTE — Discharge Instructions (Addendum)
1.  Take lactulose as needed for bowel movements. 2.  Take these over-the-counter medicines in order to regulate daily bowel movements: MiraLAX Stool softener such as Colace Fiber Plenty of fluids 3.  Return to the ER for worsening symptoms, persistent vomiting, difficulty breathing or other concerns.

## 2019-09-04 NOTE — ED Notes (Signed)
Pt to CT at this time.

## 2019-09-04 NOTE — ED Notes (Signed)
E signature pad not working. Pt educated on discharge instructions and verbalized understanding. Pt discharged and departed with friend.

## 2019-09-04 NOTE — ED Provider Notes (Signed)
[facility] Emergency Department Provider Note   ____________________________________________   First MD Initiated Contact with Patient 09/04/19 [phone]     (approximate)  I have reviewed the triage vital signs and the nursing notes.   HISTORY  Chief Complaint Abdominal Pain    HPI Scott Smith is a 43 y.o. male who presents to the ED from home with a chief complaint of constipation.  Patient had hemorrhoid surgery on 11/13.  Has been taking stool softeners but no BM since before the surgery.  Called the office today and was told to take MiraLAX.  Denies abdominal pain; just feels full.  Endorses nausea without vomiting.  Denies fever, cough, chest pain, shortness of breath, dysuria.       Past Medical History:  Diagnosis Date  . Medical history non-contributory     Patient Active Problem List   Diagnosis Date Noted  . Hemorrhoids     Past Surgical History:  Procedure Laterality Date  . HEMORRHOID SURGERY    . HEMORRHOID SURGERY N/A 08/27/2019   Procedure: HEMORRHOIDECTOMY;  Surgeon: Duanne Guess, MD;  Location: [facility] ORS;  Service: General;  Laterality: N/A;  . KNEE SURGERY Right     Prior to Admission medications   Medication Sig Start Date End Date Taking? Authorizing Provider  acetaminophen (TYLENOL) 500 MG tablet Take 2 tablets (1,000 mg total) by mouth every 6 (six) hours. Take on a scheduled basis for the first 48 hours after surgery.  Then you may take every 6 hours as needed for mild-to-moderate pain. 08/27/19 08/26/20  Duanne Guess, MD  docusate sodium (COLACE) 100 MG capsule Take 1 capsule (100 mg total) by mouth 2 (two) times daily. 08/27/19 09/26/19  Duanne Guess, MD  fluticasone (FLONASE) 50 MCG/ACT nasal spray Place 1 spray into both nostrils 2 (two) times daily.    [provider]  ibuprofen (ADVIL) 800 MG tablet Take 1 tablet (800 mg total) by mouth every 8 (eight) hours as needed. 08/27/19   Duanne Guess, MD  loratadine (CLARITIN) 10 MG tablet Take 10 mg by mouth daily.    [provider]  oxyCODONE (OXY IR/ROXICODONE) 5 MG immediate release tablet Take 1 tablet (5 mg total) by mouth every 6 (six) hours as needed for severe pain. 08/27/19   Duanne Guess, MD    Allergies Patient has no known allergies.  No family history on file.  Social History Social History   Tobacco Use  . Smoking status: Current Every Day Smoker    Packs/day: 0.50    Years: 25.00    Pack years: 12.50  . Smokeless tobacco: Never Used  Substance Use Topics  . Alcohol use: Yes    Comment: occassionally  . Drug use: Not Currently    Review of Systems  Constitutional: No fever/chills Eyes: No visual changes. ENT: No sore throat. Cardiovascular: Denies chest pain. Respiratory: Denies shortness of breath. Gastrointestinal: Positive for abdominal discomfort and nausea, no vomiting.  No diarrhea.  No constipation. Genitourinary: Negative for dysuria. Musculoskeletal: Negative for back pain. Skin: Negative for rash. Neurological: Negative for headaches, focal weakness or numbness.   ____________________________________________   PHYSICAL EXAM:  VITAL SIGNS: ED Triage Vitals  Enc Vitals Group     BP 09/03/19 2106 (!) 158/87     Pulse Rate 09/03/19 2106 78     Resp 09/03/19 2106 18     Temp 09/03/19 2106 98 F (36.7 C)     Temp Source 09/03/19 2106 Oral  SpO2 09/03/19 2106 100 %     Weight 09/03/19 2104 162 lb (73.5 kg)     Height 09/03/19 2104 5\' 6"  (1.676 m)     Head Circumference --      Peak Flow --      Pain Score 09/03/19 2104 5     Pain Loc --      Pain Edu? --      Excl. in GC? --     Constitutional: Alert and oriented. Well appearing and in mild acute distress.  Sitting on the commode having a bowel movement. Eyes: Conjunctivae are normal. PERRL. EOMI. Head: Atraumatic. Nose: No congestion/rhinnorhea. Mouth/Throat: Mucous membranes are moist.  Oropharynx  non-erythematous. Neck: No stridor.   Cardiovascular: Normal rate, regular rhythm. Grossly normal heart sounds.  Good peripheral circulation. Respiratory: Normal respiratory effort.  No retractions. Lungs CTAB. Gastrointestinal: Soft and nontender to light or deep palpation. No distention. No abdominal bruits. No CVA tenderness. Musculoskeletal: No lower extremity tenderness nor edema.  No joint effusions. Neurologic:  Normal speech and language. No gross focal neurologic deficits are appreciated. No gait instability. Skin:  Skin is warm, dry and intact. No rash noted. Psychiatric: Mood and affect are normal. Speech and behavior are normal.  ____________________________________________   LABS (all labs ordered are listed, but only abnormal results are displayed)  Labs Reviewed  COMPREHENSIVE METABOLIC PANEL - Abnormal; Notable for the following components:      Result Value   Total Protein 8.5 (*)    All other components within normal limits  CBC WITH DIFFERENTIAL/PLATELET  TYPE AND SCREEN   ____________________________________________  EKG  None ____________________________________________  RADIOLOGY  ED MD interpretation: Numerous fluid levels concerning for bowel obstruction; CT demonstrates no acute intra-abdominal or pelvic process  Official radiology report(s): Ct Abdomen Pelvis W Contrast  Result Date: 09/04/2019 CLINICAL DATA:  43 year old male with history of recent hemorrhoidectomy, no bowel movement since. EXAM: CT ABDOMEN AND PELVIS WITH CONTRAST TECHNIQUE: Multidetector CT imaging of the abdomen and pelvis was performed using the standard protocol following bolus administration of intravenous contrast. CONTRAST:  100mL OMNIPAQUE IOHEXOL 300 MG/ML  SOLN COMPARISON:  None available. FINDINGS: Lower chest: Visualized lung bases are clear. Hepatobiliary: Liver demonstrates a normal contrast enhanced appearance. Gallbladder within normal limits. No biliary dilatation.  Pancreas: Pancreas within normal limits. Spleen: Spleen within normal limits. Adrenals/Urinary Tract: Adrenal glands are normal. Kidneys equal size with symmetric enhancement. Subcentimeter hypodensity within the upper pole the right kidney too small the characterize by CT. No nephrolithiasis, hydronephrosis or focal enhancing renal mass. No visible hydroureter. Partially distended bladder within normal limits. Stomach/Bowel: Stomach within normal limits. No evidence for bowel obstruction. Appendix within normal limits. No abnormal wall thickening, mucosal enhancement, or inflammatory fat stranding seen about the bowels. No complication related to recent hemorrhoidectomy seen. Vascular/Lymphatic: Normal intravascular enhancement seen throughout the intra-abdominal aorta. Mesenteric vessels patent proximally. No adenopathy. Reproductive: Prostate and seminal vesicles within normal limits. Other: No free air or fluid. Musculoskeletal: No acute osseous abnormality. No discrete lytic or blastic osseous lesions. Lumbar levoscoliosis noted. IMPRESSION: No CT evidence for acute intra-abdominal or pelvic process. No evidence for obstruction, or other complication related to recent hemorrhoidectomy identified. Electronically Signed   By: Rise MuBenjamin  McClintock M.D.   On: 09/04/2019 02:36   Dg Abdomen Acute W/chest  Result Date: 09/03/2019 CLINICAL DATA:  No bowel movements for 1 week EXAM: DG ABDOMEN ACUTE W/ 1V CHEST COMPARISON:  Chest x-ray 08/24/2010 FINDINGS: Single-view chest demonstrates scoliosis. No  focal opacity or pleural effusion. Normal heart size. Supine and upright views of the abdomen demonstrate no free air beneath the diaphragm. Relative paucity of bowel gas. Multiple fluid levels on upright exam. No radiopaque calculi. IMPRESSION: 1. No radiographic evidence for acute cardiopulmonary abnormality 2. Relative paucity of gas with numerous fluid levels on upright examination, findings raise concern for bowel  obstruction. CT recommended for further evaluation. Electronically Signed   By: Donavan Foil M.D.   On: 09/03/2019 21:40    ____________________________________________   PROCEDURES  Procedure(s) performed (including Critical Care):  Procedures   ____________________________________________   INITIAL IMPRESSION / ASSESSMENT AND PLAN / ED COURSE  As part of my medical decision making, I reviewed the following data within the [place] notes reviewed and incorporated, Labs reviewed, Old chart reviewed, Radiograph reviewed and Notes from prior ED visits     PHONG ISENBERG was evaluated in Emergency Department on 09/04/2019 for the symptoms described in the history of present illness. He was evaluated in the context of the global COVID-19 pandemic, which necessitated consideration that the patient might be at risk for infection with the SARS-CoV-2 virus that causes COVID-19. Institutional protocols and algorithms that pertain to the evaluation of patients at risk for COVID-19 are in a state of rapid change based on information released by regulatory bodies including the CDC and federal and state organizations. These policies and algorithms were followed during the patient's care in the ED.    43 year old male who presents with constipation status post hemorrhoid surgery.  Differential diagnosis includes but is not limited to constipation, ileus, SBO, etc.  Laboratory results unremarkable.  X-ray with fluid levels concerning for SBO.  Patient currently drinking oral contrast in preparation for CT scan.  At the time of my interview he is sitting on the commode having a bowel movement.   Clinical Course as of Sep 04 307  Sat Sep 04, 2019  6144 Patient had a very large bowel movement and is feeling better.  Updated him on CT results.  Will discharge home with prescription for Lactulose as needed.  Strict return precautions given.  Patient verbalizes understanding  agrees with plan of care.   [JS]    Clinical Course User Index [JS] Paulette Blanch, MD     ____________________________________________   FINAL CLINICAL IMPRESSION(S) / ED DIAGNOSES  Final diagnoses:  Constipation, unspecified constipation type     ED Discharge Orders    None       Note:  This document was prepared using Dragon voice recognition software and may include unintentional dictation errors.   Paulette Blanch, MD 09/04/19 [phone]

## 2019-09-07 ENCOUNTER — Telehealth: Payer: Self-pay | Admitting: *Deleted

## 2019-09-07 NOTE — Telephone Encounter (Signed)
Spoke with patient's girlfriend to notify her that per Dr.Cannon patient may take over the counter Magnesium Citrate to help with constipation. Dr.Cannon also recommends patient taking Tylenol 1000 mg every six hours and Ibuprofen 600 mg the other six hours to help with the pain and discomfort he is experiencing. Patient's girlfriend verbalizes understanding.

## 2019-09-07 NOTE — Telephone Encounter (Signed)
Patients girlfriend Scott Smith called and stated that [name] had hemorrhoid surgery on 08/27/19 by Dr.Cannon.  He is experiencing excruciating pain to where he is "screaming". He has been nauseated and unable to have a bowel movement. He has been taking oxycodone and ibuprofen for the pain and also taking laxatives for the constipation. Please call and advise

## 2019-09-27 ENCOUNTER — Telehealth: Payer: Self-pay | Admitting: General Surgery

## 2019-09-27 NOTE — Telephone Encounter (Signed)
Patient instructed to take Miralax and continue with stool softeners  daily. Increase water intake. Avoid constipation. Patient stated he just started stool softner today and took a dose of milk of magnesium. He was instructed to call if no bowel movement after trying the above.

## 2019-09-27 NOTE — Telephone Encounter (Signed)
Patient is calling back again said he could be reached at [phone]. Please call and advise

## 2019-09-27 NOTE — Telephone Encounter (Signed)
Patient has called stating that he has not had a bowel movement in 3 days. Patient had a hemorrhoidectomy on 08/27/19 by Dr Celine Ahr. He states that he has been taking stool softeners and a laxative. I did ask if he has been drinking plenty of fluid and he stated no. He also complains of back pain. He asked if this could be from him being constipated. I informed him that constipation can cause lower back pain. He would like a nurse to call him back at [phone] with his concerns.

## 2019-09-27 NOTE — Telephone Encounter (Signed)
Left message for patient to return call to office. 

## 2019-09-28 ENCOUNTER — Telehealth: Payer: Self-pay | Admitting: General Surgery

## 2019-09-28 ENCOUNTER — Telehealth: Payer: Self-pay

## 2019-09-28 NOTE — Telephone Encounter (Signed)
patient called said he tried the miralax and still not being able to use the bathroom, please call patient and advise. Patient said he could be reached at [phone]

## 2019-09-28 NOTE — Telephone Encounter (Signed)
Tried reaching patient, unable to leave message due to mail box full. Patient can take Fleets enema and drink full bottle of magnesium citrate and drink plenty of water. The above can be purchased at [facility] or drug store.

## 2019-09-28 NOTE — Telephone Encounter (Signed)
Spoke with patient -he had good results with the enema. He was instructed to take Miralax daily until he achieved daily bowel movements along with stool softener. If he begins to have watery stool then stop the miralax however he was reminded to avoid constipation.

## 2019-09-28 NOTE — Telephone Encounter (Signed)
Error wrong doctor  

## 2019-09-30 ENCOUNTER — Other Ambulatory Visit: Payer: Self-pay

## 2019-09-30 ENCOUNTER — Ambulatory Visit (INDEPENDENT_AMBULATORY_CARE_PROVIDER_SITE_OTHER): Payer: Self-pay | Admitting: General Surgery

## 2019-09-30 ENCOUNTER — Encounter: Payer: Self-pay | Admitting: General Surgery

## 2019-09-30 VITALS — BP 110/71 | HR 67 | Temp 97.9°F | Resp 12 | Ht 66.0 in | Wt 151.2 lb

## 2019-09-30 DIAGNOSIS — Z8719 Personal history of other diseases of the digestive system: Secondary | ICD-10-CM

## 2019-09-30 DIAGNOSIS — Z9889 Other specified postprocedural states: Secondary | ICD-10-CM

## 2019-09-30 NOTE — Patient Instructions (Signed)
Avoid constipation. Take the Miralax when needed. You may take colace daily. You may resume normal activities.

## 2019-09-30 NOTE — Progress Notes (Signed)
Scott Smith is here today for a postoperative visit.  He is a 44 year old man who had a hemorrhoidectomy on 27 August 2019.  Postoperatively, he has had significant issues with constipation.  He even presented to the emergency department on November 21 for this issue.  He has contacted our office on multiple occasions and received advice regarding stool softeners, enemas, and other agents to try and aid in his constipation.  Today, he states that finally, the constipation has resolved with a combination of MiraLAX, stool softeners and an enema.  He is feeling much better.  He denies any fevers or chills.  No nausea or vomiting.  His postoperative pain has resolved.  Today's Vitals   09/30/19 0913  BP: 110/71  Pulse: 67  Resp: 12  Temp: 97.9 F (36.6 C)  TempSrc: Temporal  SpO2: 97%  Weight: 151 lb 3.2 oz (68.6 kg)  Height: 5\' 6"  (1.676 m)   Body mass index is 24.4 kg/m.  Focused examination: The site of the hemorrhoid excision has healed nicely.  There is no induration, purulent or mucoid drainage, no bleeding.  Impression and plan: This is a 43 year old man who underwent a hemorrhoidectomy.  Postsurgically, he had a lot of issues with constipation which is now resolved.  He is doing well.  He was counseled to continue a bowel regimen to avoid becoming constipated in the future, which likely contributed to his hemorrhoid development.  He may resume all of his usual activities without restriction.  We will see him on an as-needed basis.

## 2019-10-21 ENCOUNTER — Ambulatory Visit: Payer: Self-pay | Admitting: Gastroenterology

## 2019-10-21 ENCOUNTER — Encounter: Payer: Self-pay | Admitting: Gastroenterology

## 2019-10-21 DIAGNOSIS — K649 Unspecified hemorrhoids: Secondary | ICD-10-CM

## 2019-10-25 ENCOUNTER — Encounter: Payer: Self-pay | Admitting: Emergency Medicine

## 2019-10-25 ENCOUNTER — Emergency Department
Admission: EM | Admit: 2019-10-25 | Discharge: 2019-10-25 | Disposition: A | Discharge status: Home / Self Care | Payer: Medicaid Other | Attending: Emergency Medicine | Admitting: Emergency Medicine

## 2019-10-25 ENCOUNTER — Other Ambulatory Visit: Payer: Self-pay

## 2019-10-25 DIAGNOSIS — R1084 Generalized abdominal pain: Secondary | ICD-10-CM | POA: Insufficient documentation

## 2019-10-25 DIAGNOSIS — F17210 Nicotine dependence, cigarettes, uncomplicated: Secondary | ICD-10-CM | POA: Insufficient documentation

## 2019-10-25 DIAGNOSIS — Z79899 Other long term (current) drug therapy: Secondary | ICD-10-CM | POA: Insufficient documentation

## 2019-10-25 LAB — URINALYSIS, COMPLETE (UACMP) WITH MICROSCOPIC
Bacteria, UA: NONE SEEN
Bilirubin Urine: NEGATIVE
Glucose, UA: NEGATIVE mg/dL
Hgb urine dipstick: NEGATIVE
Ketones, ur: NEGATIVE mg/dL
Leukocytes,Ua: NEGATIVE
Nitrite: NEGATIVE
Protein, ur: 30 mg/dL — AB
Specific Gravity, Urine: 1.02 (ref 1.005–1.030)
Squamous Epithelial / HPF: NONE SEEN (ref 0–5)
pH: 7 (ref 5.0–8.0)

## 2019-10-25 LAB — COMPREHENSIVE METABOLIC PANEL
ALT: 23 U/L (ref 0–44)
AST: 24 U/L (ref 15–41)
Albumin: 4.6 g/dL (ref 3.5–5.0)
Alkaline Phosphatase: 71 U/L (ref 38–126)
Anion gap: 8 (ref 5–15)
BUN: 11 mg/dL (ref 6–20)
CO2: 27 mmol/L (ref 22–32)
Calcium: 9.4 mg/dL (ref 8.9–10.3)
Chloride: 103 mmol/L (ref 98–111)
Creatinine, Ser: 1.21 mg/dL (ref 0.61–1.24)
GFR calc Af Amer: >60 mL/min (ref >60)
GFR calc non Af Amer: >60 mL/min (ref >60)
Glucose, Bld: 78 mg/dL (ref 70–99)
Potassium: 4.2 mmol/L (ref 3.5–5.1)
Sodium: 138 mmol/L (ref 135–145)
Total Bilirubin: 0.9 mg/dL (ref 0.3–1.2)
Total Protein: 7.9 g/dL (ref 6.5–8.1)

## 2019-10-25 LAB — CBC
HCT: 49.8 % (ref 39.0–52.0)
Hemoglobin: 16.5 g/dL (ref 13.0–17.0)
MCH: 30.6 pg (ref 26.0–34.0)
MCHC: 33.1 g/dL (ref 30.0–36.0)
MCV: 92.4 fL (ref 80.0–100.0)
Platelets: 302 10*3/uL (ref 150–400)
RBC: 5.39 MIL/uL (ref 4.22–5.81)
RDW: 14.3 % (ref 11.5–15.5)
WBC: 5.1 10*3/uL (ref 4.0–10.5)
nRBC: 0 % (ref 0.0–0.2)

## 2019-10-25 LAB — LIPASE, BLOOD: Lipase: 35 U/L (ref 11–51)

## 2019-10-25 MED ORDER — SODIUM CHLORIDE 0.9% FLUSH: 3.0000 mL | Freq: Once | INTRAVENOUS | Status: DC

## 2019-10-25 MED ORDER — PANTOPRAZOLE SODIUM 20 MG PO TBEC: 20.0000 mg | DELAYED_RELEASE_TABLET | Freq: Every day | ORAL | 1 refills | Status: AC

## 2019-10-25 MED ORDER — AMOXICILLIN 500 MG PO CAPS: 500.0000 mg | ORAL_CAPSULE | Freq: Two times a day (BID) | ORAL | 0 refills | Status: AC

## 2019-10-25 NOTE — ED Notes (Signed)
Pt reports had hemorrhoid surgery about a month and a half ago and since then, every time he eats, he gets sharp pains in his abd and he feels nauseated. Pt reports pain is to left upper and then goes down and across his abd. Pt also states that now when he eats he has diarrhea right after. Pt also reports swelling to his left knee for the past 4 days. Pt reports he woke up one morning and it was swollen. Pt also would like for someone to look at his right ear.

## 2019-10-25 NOTE — ED Triage Notes (Signed)
Abdominal pain x 1 1/2 months. No recent vomiting or diarrhea.

## 2019-10-25 NOTE — ED Provider Notes (Signed)
[facility] Emergency Department Provider Note   ____________________________________________    I have reviewed the triage vital signs and the nursing notes.   HISTORY  Chief Complaint Abdominal Pain     HPI Scott Smith is a 44 y.o. male who presents with complaints of decreased p.o. intake because of abdominal discomfort after eating for several months.  He reports normal stools.  No vomiting.  Does not take anything for this.  Describes burning sensation.  Reports he has lost weight because of this.  Also complains of discomfort in his right ear x2 days.  Additionally complains of pain in his left knee which is somewhat chronic.  Has not taken anything for these complaints  Past Medical History:  Diagnosis Date  . Medical history non-contributory     Patient Active Problem List   Diagnosis Date Noted  . Hemorrhoids     Past Surgical History:  Procedure Laterality Date  . HEMORRHOID SURGERY    . HEMORRHOID SURGERY N/A 08/27/2019   Procedure: HEMORRHOIDECTOMY;  Surgeon: Fredirick Maudlin, MD;  Location: [facility] ORS;  Service: General;  Laterality: N/A;  . KNEE SURGERY Right     Prior to Admission medications   Medication Sig Start Date End Date Taking? Authorizing Provider  acetaminophen (TYLENOL) 500 MG tablet Take 2 tablets (1,000 mg total) by mouth every 6 (six) hours. Take on a scheduled basis for the first 48 hours after surgery.  Then you may take every 6 hours as needed for mild-to-moderate pain. 08/27/19 08/26/20  Fredirick Maudlin, MD  amoxicillin (AMOXIL) 500 MG capsule Take 1 capsule (500 mg total) by mouth 2 (two) times daily for 7 days. 10/25/19 11/01/19  Lavonia Drafts, MD  fluticasone (FLONASE) 50 MCG/ACT nasal spray Place 1 spray into both nostrils 2 (two) times daily.    [provider]  ibuprofen (ADVIL) 800 MG tablet Take 1 tablet (800 mg total) by mouth every 8 (eight) hours as needed. 08/27/19   Fredirick Maudlin, MD   lactulose (CHRONULAC) 10 GM/15ML solution Take 30 mLs (20 g total) by mouth daily as needed for mild constipation. 09/04/19   Paulette Blanch, MD  loratadine (CLARITIN) 10 MG tablet Take 10 mg by mouth daily.    [provider]  pantoprazole (PROTONIX) 20 MG tablet Take 1 tablet (20 mg total) by mouth daily. 10/25/19 10/24/20  Lavonia Drafts, MD     Allergies Patient has no known allergies.  No family history on file.  Social History Social History   Tobacco Use  . Smoking status: Current Every Day Smoker    Packs/day: 0.50    Years: 25.00    Pack years: 12.50  . Smokeless tobacco: Never Used  Substance Use Topics  . Alcohol use: Yes    Comment: occassionally  . Drug use: Not Currently    Review of Systems  Constitutional: No fever/chills Eyes: No visual changes.  ENT: As above Cardiovascular: Denies chest pain. Respiratory: Denies shortness of breath. Gastrointestinal: As above Genitourinary: Negative for dysuria. Musculoskeletal: As above Skin: Negative for rash. Neurological: Negative for headaches or weakness   ____________________________________________   PHYSICAL EXAM:  VITAL SIGNS: ED Triage Vitals  Enc Vitals Group     BP 10/25/19 1333 (!) 149/88     Pulse Rate 10/25/19 1333 69     Resp 10/25/19 1333 18     Temp 10/25/19 1333 (!) 97.5 F (36.4 C)     Temp Source 10/25/19 1333 Oral  SpO2 10/25/19 1333 99 %     Weight 10/25/19 1332 63 kg (139 lb)     Height 10/25/19 1332 1.676 m (5\' 6" )     Head Circumference --      Peak Flow --      Pain Score 10/25/19 1337 8     Pain Loc --      Pain Edu? --      Excl. in GC? --     Constitutional: Alert and oriented.   Ear: Fullness of the right TM, no discharge Mouth/Throat: Mucous membranes are moist.   Neck:  Painless ROM Cardiovascular: Normal rate, regular rhythm.  Good peripheral circulation. Respiratory: Normal respiratory effort.  No retractions. Gastrointestinal: Soft and nontender. No  distention.  No CVA tenderness.  No masses Genitourinary: deferred Musculoskeletal: Reassuring lower extremity exam, no knee effusion. Neurologic:  Normal speech and language. No gross focal neurologic deficits are appreciated.  Skin:  Skin is warm, dry and intact. No rash noted. Psychiatric: Mood and affect are normal. Speech and behavior are normal.  ____________________________________________   LABS (all labs ordered are listed, but only abnormal results are displayed)  Labs Reviewed  URINALYSIS, COMPLETE (UACMP) WITH MICROSCOPIC - Abnormal; Notable for the following components:      Result Value   Color, Urine YELLOW (*)    APPearance CLEAR (*)    Protein, ur 30 (*)    All other components within normal limits  LIPASE, BLOOD  COMPREHENSIVE METABOLIC PANEL  CBC   ____________________________________________  EKG  None ____________________________________________  RADIOLOGY   ____________________________________________   PROCEDURES  Procedure(s) performed: No  Procedures   Critical Care performed: No ____________________________________________   INITIAL IMPRESSION / ASSESSMENT AND PLAN / ED COURSE  Pertinent labs & imaging results that were available during my care of the patient were reviewed by me and considered in my medical decision making (see chart for details).  Patient overall well-appearing in no acute distress, lab work is completely unremarkable.  Exam consistent with possible otitis media, afebrile.  Suspect gastritis versus PUD as the cause of his discomfort with eating, will start him on a PPI, have referred him to gastroenterology.  Tylenol/icing for left knee, follow-up with PCP    ____________________________________________   FINAL CLINICAL IMPRESSION(S) / ED DIAGNOSES  Final diagnoses:  Generalized abdominal pain        Note:  This document was prepared using Dragon voice recognition software and may include unintentional  dictation errors.   12/23/19, MD 10/25/19 [phone]

## 2019-10-25 NOTE — ED Notes (Signed)
Pt verbalizes understanding of d/c instructions, medications and follow up 

## 2019-11-11 ENCOUNTER — Other Ambulatory Visit: Payer: Self-pay

## 2019-11-11 ENCOUNTER — Encounter: Payer: Self-pay | Admitting: Gastroenterology

## 2019-11-11 ENCOUNTER — Ambulatory Visit: Payer: Self-pay | Admitting: Gastroenterology

## 2019-11-11 DIAGNOSIS — K644 Residual hemorrhoidal skin tags: Secondary | ICD-10-CM

## 2019-12-23 ENCOUNTER — Emergency Department: Payer: Medicaid Other

## 2019-12-23 ENCOUNTER — Other Ambulatory Visit: Payer: Self-pay

## 2019-12-23 ENCOUNTER — Emergency Department
Admission: EM | Admit: 2019-12-23 | Discharge: 2019-12-23 | Disposition: A | Discharge status: Home / Self Care | Payer: Medicaid Other | Attending: Emergency Medicine | Admitting: Emergency Medicine

## 2019-12-23 DIAGNOSIS — J3489 Other specified disorders of nose and nasal sinuses: Secondary | ICD-10-CM | POA: Insufficient documentation

## 2019-12-23 DIAGNOSIS — Y929 Unspecified place or not applicable: Secondary | ICD-10-CM | POA: Insufficient documentation

## 2019-12-23 DIAGNOSIS — H1132 Conjunctival hemorrhage, left eye: Secondary | ICD-10-CM | POA: Insufficient documentation

## 2019-12-23 DIAGNOSIS — Y999 Unspecified external cause status: Secondary | ICD-10-CM | POA: Insufficient documentation

## 2019-12-23 DIAGNOSIS — F17200 Nicotine dependence, unspecified, uncomplicated: Secondary | ICD-10-CM | POA: Insufficient documentation

## 2019-12-23 DIAGNOSIS — S0993XA Unspecified injury of face, initial encounter: Secondary | ICD-10-CM

## 2019-12-23 DIAGNOSIS — G44311 Acute post-traumatic headache, intractable: Secondary | ICD-10-CM | POA: Insufficient documentation

## 2019-12-23 DIAGNOSIS — S0031XA Abrasion of nose, initial encounter: Secondary | ICD-10-CM | POA: Insufficient documentation

## 2019-12-23 DIAGNOSIS — Z79899 Other long term (current) drug therapy: Secondary | ICD-10-CM | POA: Insufficient documentation

## 2019-12-23 DIAGNOSIS — S00212A Abrasion of left eyelid and periocular area, initial encounter: Secondary | ICD-10-CM | POA: Insufficient documentation

## 2019-12-23 DIAGNOSIS — Y9389 Activity, other specified: Secondary | ICD-10-CM | POA: Insufficient documentation

## 2019-12-23 MED ORDER — FLUTICASONE PROPIONATE 50 MCG/ACT NA SUSP: 1.0000 | Freq: Two times a day (BID) | NASAL | 0 refills | Status: DC

## 2019-12-23 MED ORDER — CYCLOBENZAPRINE HCL 10 MG PO TABS: 10.0000 mg | ORAL_TABLET | Freq: Three times a day (TID) | ORAL | 0 refills | Status: DC | PRN

## 2019-12-23 MED ORDER — NAPROXEN 500 MG PO TABS: 500.0000 mg | ORAL_TABLET | Freq: Two times a day (BID) | ORAL | 0 refills | Status: DC

## 2019-12-23 NOTE — Discharge Instructions (Signed)
Please follow up with primary care or return to the ER for symptoms that are not improving over the next few days.
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
## 2020-02-20 ENCOUNTER — Observation Stay (HOSPITAL_COMMUNITY)
Admission: EM | Admit: 2020-02-20 | Discharge: 2020-02-20 | Disposition: A | Discharge status: Home / Self Care | Payer: Medicaid Other | Attending: General Surgery | Admitting: General Surgery

## 2020-02-20 ENCOUNTER — Emergency Department (HOSPITAL_COMMUNITY): Payer: Medicaid Other

## 2020-02-20 ENCOUNTER — Encounter (HOSPITAL_COMMUNITY): Payer: Self-pay | Admitting: Emergency Medicine

## 2020-02-20 DIAGNOSIS — Z23 Encounter for immunization: Secondary | ICD-10-CM | POA: Insufficient documentation

## 2020-02-20 DIAGNOSIS — S31119A Laceration without foreign body of abdominal wall, unspecified quadrant without penetration into peritoneal cavity, initial encounter: Secondary | ICD-10-CM

## 2020-02-20 DIAGNOSIS — W269XXA Contact with unspecified sharp object(s), initial encounter: Secondary | ICD-10-CM | POA: Insufficient documentation

## 2020-02-20 DIAGNOSIS — W3400XA Accidental discharge from unspecified firearms or gun, initial encounter: Secondary | ICD-10-CM

## 2020-02-20 DIAGNOSIS — T07XXXA Unspecified multiple injuries, initial encounter: Secondary | ICD-10-CM | POA: Diagnosis present

## 2020-02-20 DIAGNOSIS — S36039A Unspecified laceration of spleen, initial encounter: Secondary | ICD-10-CM

## 2020-02-20 DIAGNOSIS — F10129 Alcohol abuse with intoxication, unspecified: Secondary | ICD-10-CM | POA: Insufficient documentation

## 2020-02-20 DIAGNOSIS — Z20822 Contact with and (suspected) exposure to covid-19: Secondary | ICD-10-CM | POA: Insufficient documentation

## 2020-02-20 DIAGNOSIS — S21339A Puncture wound without foreign body of unspecified front wall of thorax with penetration into thoracic cavity, initial encounter: Secondary | ICD-10-CM

## 2020-02-20 DIAGNOSIS — S31111A Laceration without foreign body of abdominal wall, left upper quadrant without penetration into peritoneal cavity, initial encounter: Principal | ICD-10-CM | POA: Insufficient documentation

## 2020-02-20 LAB — COMPREHENSIVE METABOLIC PANEL
ALT: 21 U/L (ref 0–44)
AST: 31 U/L (ref 15–41)
Albumin: 3.7 g/dL (ref 3.5–5.0)
Alkaline Phosphatase: 63 U/L (ref 38–126)
Anion gap: 16 — ABNORMAL HIGH (ref 5–15)
BUN: 8 mg/dL (ref 6–20)
CO2: 17 mmol/L — ABNORMAL LOW (ref 22–32)
Calcium: 8.3 mg/dL — ABNORMAL LOW (ref 8.9–10.3)
Chloride: 105 mmol/L (ref 98–111)
Creatinine, Ser: 1.31 mg/dL — ABNORMAL HIGH (ref 0.61–1.24)
GFR calc Af Amer: >60 mL/min (ref >60)
GFR calc non Af Amer: >60 mL/min (ref >60)
Glucose, Bld: 99 mg/dL (ref 70–99)
Potassium: 3.2 mmol/L — ABNORMAL LOW (ref 3.5–5.1)
Sodium: 138 mmol/L (ref 135–145)
Total Bilirubin: 0.7 mg/dL (ref 0.3–1.2)
Total Protein: 6.9 g/dL (ref 6.5–8.1)

## 2020-02-20 LAB — SAMPLE TO BLOOD BANK

## 2020-02-20 LAB — GLUCOSE, CAPILLARY: Glucose-Capillary: 73 mg/dL (ref 70–99)

## 2020-02-20 LAB — CBC
HCT: 44.7 % (ref 39.0–52.0)
HCT: 36.6 % — ABNORMAL LOW (ref 39.0–52.0)
Hemoglobin: 14.8 g/dL (ref 13.0–17.0)
Hemoglobin: 12.3 g/dL — ABNORMAL LOW (ref 13.0–17.0)
MCH: 32 pg (ref 26.0–34.0)
MCH: 31.6 pg (ref 26.0–34.0)
MCHC: 33.1 g/dL (ref 30.0–36.0)
MCHC: 33.6 g/dL (ref 30.0–36.0)
MCV: 96.8 fL (ref 80.0–100.0)
MCV: 94.1 fL (ref 80.0–100.0)
Platelets: 284 10*3/uL (ref 150–400)
Platelets: 261 10*3/uL (ref 150–400)
RBC: 4.62 MIL/uL (ref 4.22–5.81)
RBC: 3.89 MIL/uL — ABNORMAL LOW (ref 4.22–5.81)
RDW: 13.4 % (ref 11.5–15.5)
RDW: 13.4 % (ref 11.5–15.5)
WBC: 11.4 10*3/uL — ABNORMAL HIGH (ref 4.0–10.5)
WBC: 10.5 10*3/uL (ref 4.0–10.5)
nRBC: 0 % (ref 0.0–0.2)
nRBC: 0 % (ref 0.0–0.2)

## 2020-02-20 LAB — URINALYSIS, ROUTINE W REFLEX MICROSCOPIC
Bacteria, UA: NONE SEEN
Bilirubin Urine: NEGATIVE
Glucose, UA: NEGATIVE mg/dL
Ketones, ur: NEGATIVE mg/dL
Leukocytes,Ua: NEGATIVE
Nitrite: NEGATIVE
Protein, ur: NEGATIVE mg/dL
Specific Gravity, Urine: 1.009 (ref 1.005–1.030)
pH: 7 (ref 5.0–8.0)

## 2020-02-20 LAB — TRAUMA TEG PANEL
CFF Max Amplitude: 4.3 mm — ABNORMAL LOW (ref 15–32)
Citrated Kaolin (R): 2.2 min — ABNORMAL LOW (ref 4.6–9.1)
Citrated Rapid TEG (MA): <40 mm — ABNORMAL LOW (ref 52–70)
Lysis at 30 Minutes: 0 % (ref 0.0–2.6)

## 2020-02-20 LAB — RESPIRATORY PANEL BY RT PCR (FLU A&B, COVID)
Influenza A by PCR: NEGATIVE
Influenza B by PCR: NEGATIVE
SARS Coronavirus 2 by RT PCR: NEGATIVE

## 2020-02-20 LAB — I-STAT CHEM 8, ED
BUN: 9 mg/dL (ref 6–20)
Calcium, Ion: 1.03 mmol/L — ABNORMAL LOW (ref 1.15–1.40)
Chloride: 104 mmol/L (ref 98–111)
Creatinine, Ser: 1.7 mg/dL — ABNORMAL HIGH (ref 0.61–1.24)
Glucose, Bld: 105 mg/dL — ABNORMAL HIGH (ref 70–99)
HCT: 43 % (ref 39.0–52.0)
Hemoglobin: 14.6 g/dL (ref 13.0–17.0)
Potassium: 3.2 mmol/L — ABNORMAL LOW (ref 3.5–5.1)
Sodium: 139 mmol/L (ref 135–145)
TCO2: 23 mmol/L (ref 22–32)

## 2020-02-20 LAB — HIV ANTIBODY (ROUTINE TESTING W REFLEX): HIV Screen 4th Generation wRfx: NONREACTIVE

## 2020-02-20 LAB — PROTIME-INR
INR: 1.2 (ref 0.8–1.2)
Prothrombin Time: 15 seconds (ref 11.4–15.2)

## 2020-02-20 LAB — ETHANOL: Alcohol, Ethyl (B): 177 mg/dL — ABNORMAL HIGH (ref <10)

## 2020-02-20 LAB — LACTIC ACID, PLASMA: Lactic Acid, Venous: 4.8 mmol/L (ref 0.5–1.9)

## 2020-02-20 MED ORDER — TETANUS-DIPHTH-ACELL PERTUSSIS 5-2.5-18.5 LF-MCG/0.5 IM SUSP
0.5000 mL | Freq: Once | INTRAMUSCULAR | Status: AC
  Administered 2020-02-20: 0.5 mL via INTRAMUSCULAR
  Filled 2020-02-20: qty 0.5

## 2020-02-20 MED ORDER — CEFAZOLIN SODIUM-DEXTROSE 1-4 GM/50ML-% IV SOLN: 1.0000 g | Freq: Once | INTRAVENOUS | Status: DC

## 2020-02-20 MED ORDER — CEFAZOLIN SODIUM-DEXTROSE 2-4 GM/100ML-% IV SOLN
2.0000 g | Freq: Once | INTRAVENOUS | Status: AC
  Administered 2020-02-20: 2 g via INTRAVENOUS

## 2020-02-20 MED ORDER — POTASSIUM CHLORIDE IN NACL 20-0.9 MEQ/L-% IV SOLN
INTRAVENOUS | Status: DC
  Filled 2020-02-20: qty 1000

## 2020-02-20 MED ORDER — FENTANYL CITRATE (PF) 100 MCG/2ML IJ SOLN
50.0000 ug | Freq: Once | INTRAMUSCULAR | Status: AC
  Administered 2020-02-20: 50 ug via INTRAVENOUS
  Filled 2020-02-20: qty 2

## 2020-02-20 MED ORDER — OXYCODONE HCL 5 MG PO TABS: 5.0000 mg | ORAL_TABLET | ORAL | Status: DC | PRN

## 2020-02-20 MED ORDER — ONDANSETRON 4 MG PO TBDP: 4.0000 mg | ORAL_TABLET | Freq: Four times a day (QID) | ORAL | Status: DC | PRN

## 2020-02-20 MED ORDER — OXYCODONE HCL 5 MG PO TABS
5.0000 mg | ORAL_TABLET | Freq: Four times a day (QID) | ORAL | 0 refills | Status: DC | PRN
Start: 2020-02-20 — End: 2021-11-01

## 2020-02-20 MED ORDER — IBUPROFEN 800 MG PO TABS
800.0000 mg | ORAL_TABLET | Freq: Three times a day (TID) | ORAL | 0 refills | Status: DC | PRN
Start: 2020-02-20 — End: 2021-11-01

## 2020-02-20 MED ORDER — ONDANSETRON HCL 4 MG/2ML IJ SOLN: 4.0000 mg | Freq: Four times a day (QID) | INTRAMUSCULAR | Status: DC | PRN

## 2020-02-20 MED ORDER — SODIUM CHLORIDE 0.9 % IV BOLUS
1000.0000 mL | Freq: Once | INTRAVENOUS | Status: AC
  Administered 2020-02-20: 1000 mL via INTRAVENOUS

## 2020-02-20 MED ORDER — OXYCODONE HCL 5 MG PO TABS
10.0000 mg | ORAL_TABLET | ORAL | Status: DC | PRN
  Administered 2020-02-20: 10 mg via ORAL
  Filled 2020-02-20: qty 2

## 2020-02-20 MED ORDER — IOHEXOL 300 MG/ML  SOLN
100.0000 mL | Freq: Once | INTRAMUSCULAR | Status: AC | PRN
  Administered 2020-02-20: 100 mL via INTRAVENOUS

## 2020-02-20 MED ORDER — MORPHINE SULFATE (PF) 2 MG/ML IV SOLN
2.0000 mg | INTRAVENOUS | Status: DC | PRN
  Administered 2020-02-20: 2 mg via INTRAVENOUS
  Filled 2020-02-20: qty 1
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
## 2020-02-20 NOTE — ED Notes (Signed)
Dr. Magnus Ivan at bedside repairing wounds, pt tolerated well

## 2021-05-06 IMAGING — CT CT ABD-PELV W/ CM
2 of 5 series · 15 of 46 positions shown, 17 images · IV contrast (APPLIED)
Comparison: None available.

CLINICAL DATA: 43-year-old male with history of recent
hemorrhoidectomy, no bowel movement since.

EXAM:
CT ABDOMEN AND PELVIS WITH CONTRAST
TECHNIQUE: Multidetector CT imaging of the abdomen and pelvis was performed
using the standard protocol following bolus administration of
intravenous contrast.
CONTRAST:  100mL OMNIPAQUE IOHEXOL 300 MG/ML  SOLN

[Series 2: routine abd/pel with · axial · 0.70mm/px · z∈[-989,-549]mm · 12 of 100 slices shown, 14 images]
[im 6/100  soft-tissue]
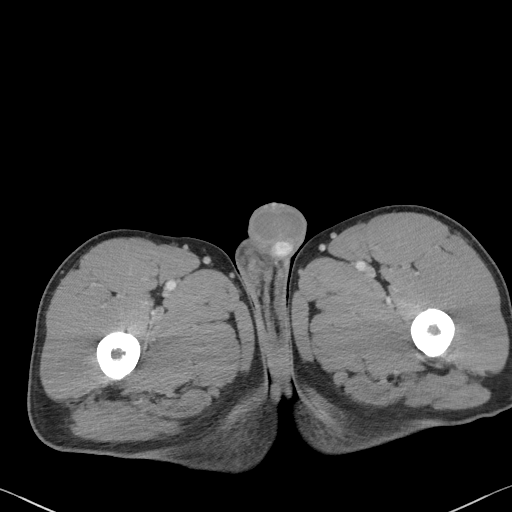
[im 6/100  bone]
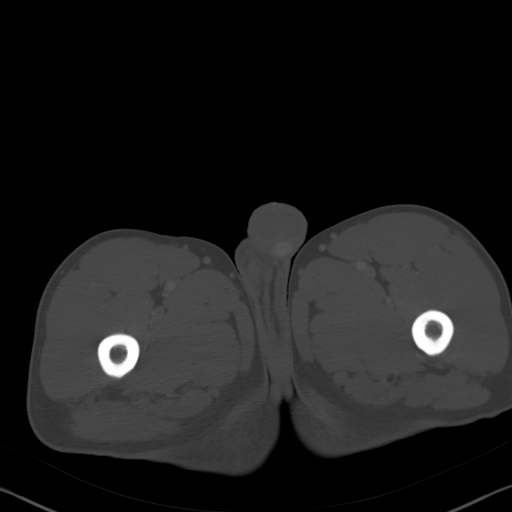
[im 16/100  soft-tissue]
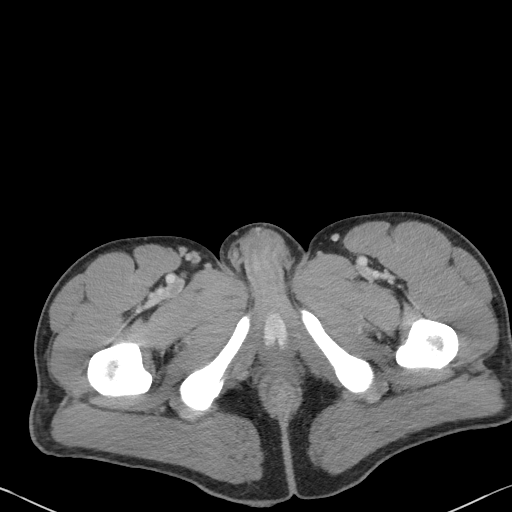
[im 21/100  soft-tissue]
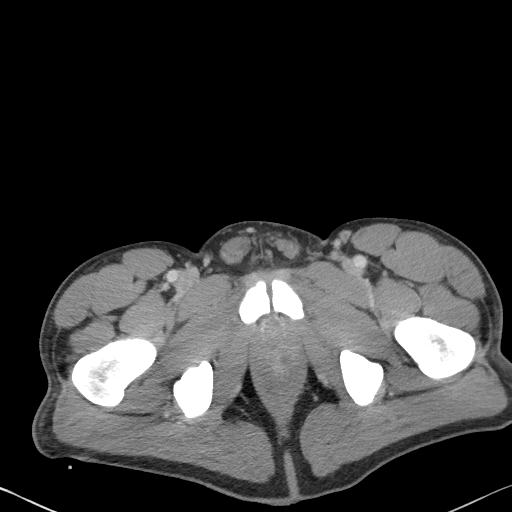
[im 32/100  soft-tissue]
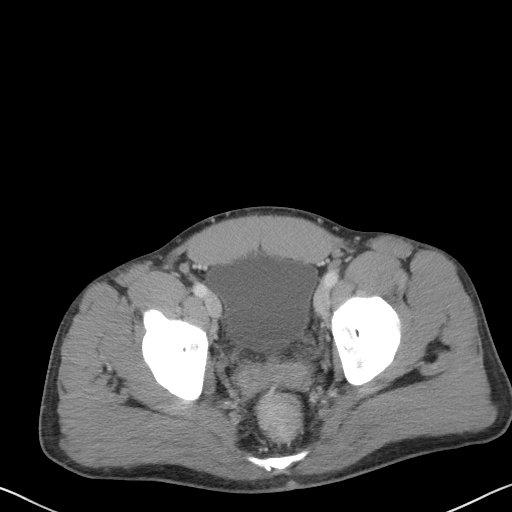
[im 37/100  soft-tissue]
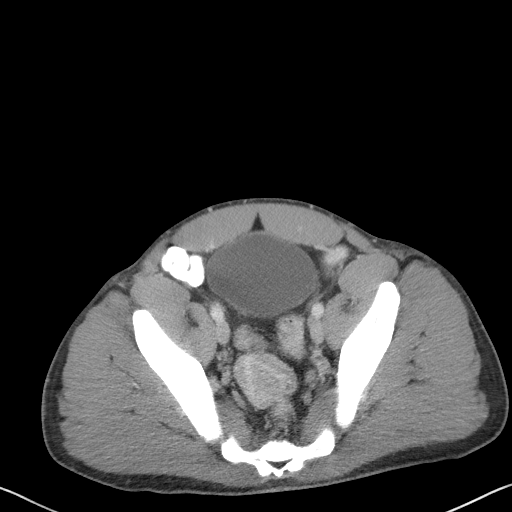
[im 47/100  soft-tissue]
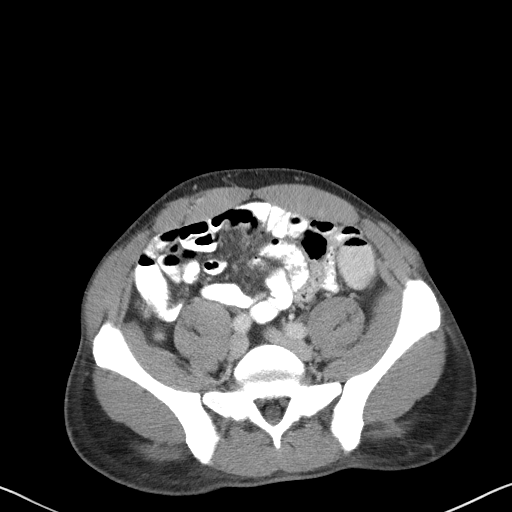
[im 53/100  soft-tissue]
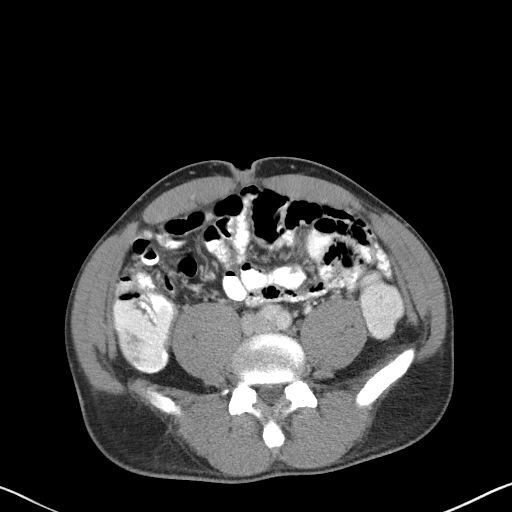
[im 63/100  soft-tissue]
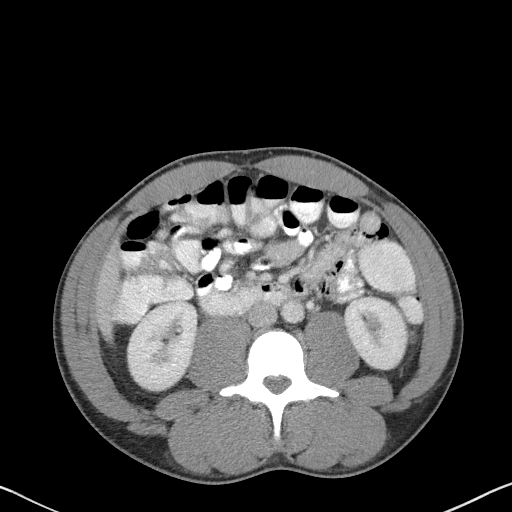
[im 68/100  soft-tissue]
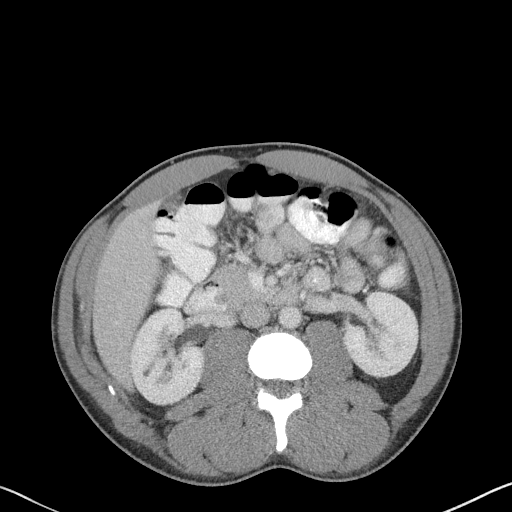
[im 68/100  bone]
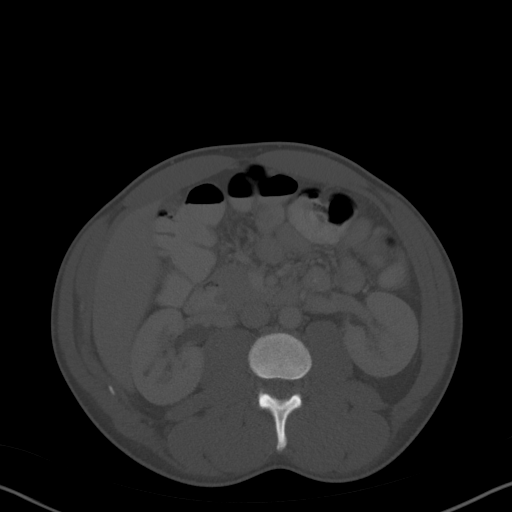
[im 79/100  soft-tissue]
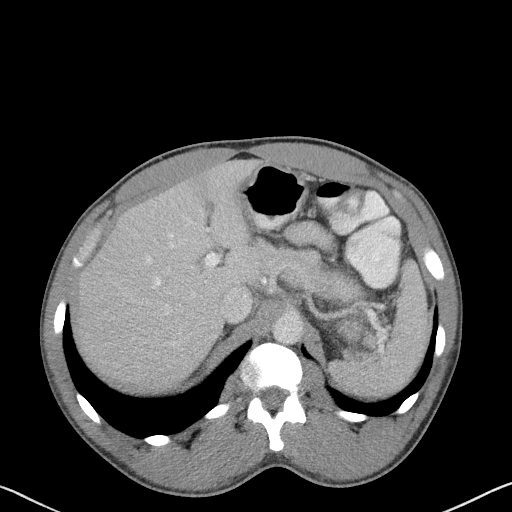
[im 84/100  soft-tissue]
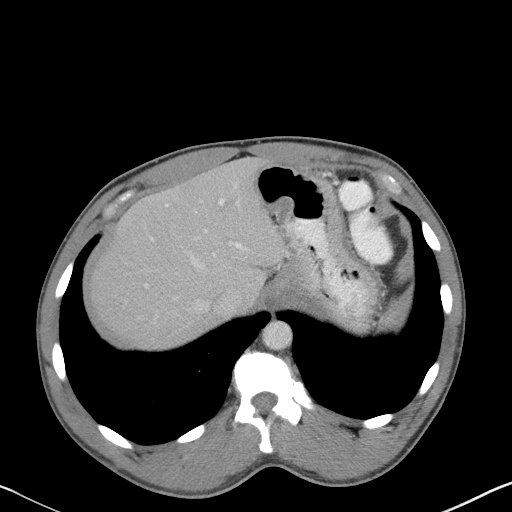
[im 94/100  soft-tissue]
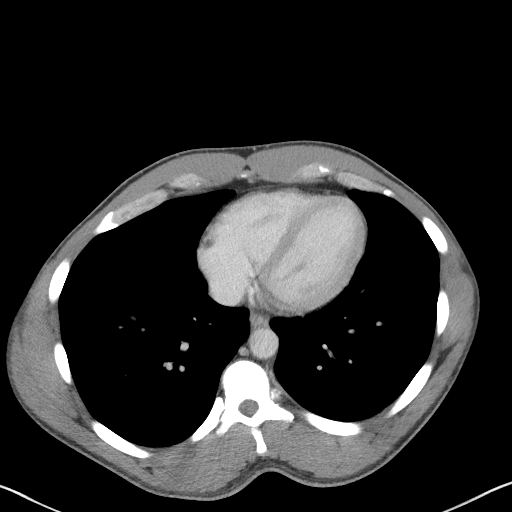

[Series 5: coronal st · coronal · 0.68mm/px · 3 of 79 slices shown]
[im 27/79  soft-tissue]
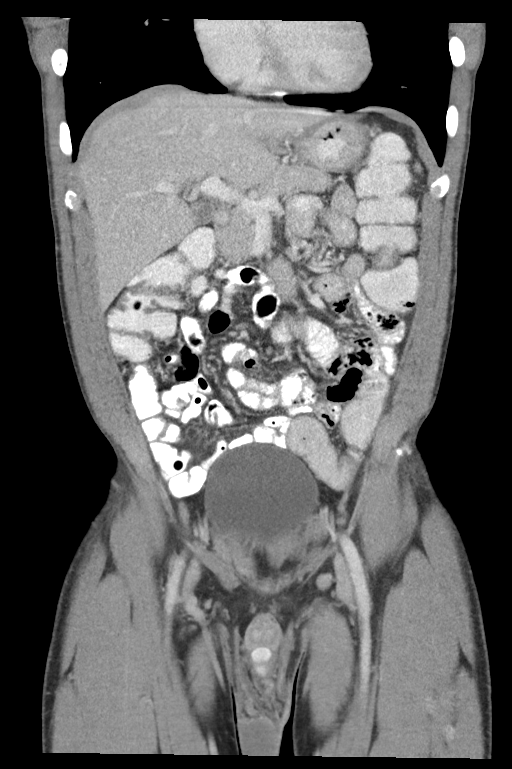
[im 35/79  soft-tissue]
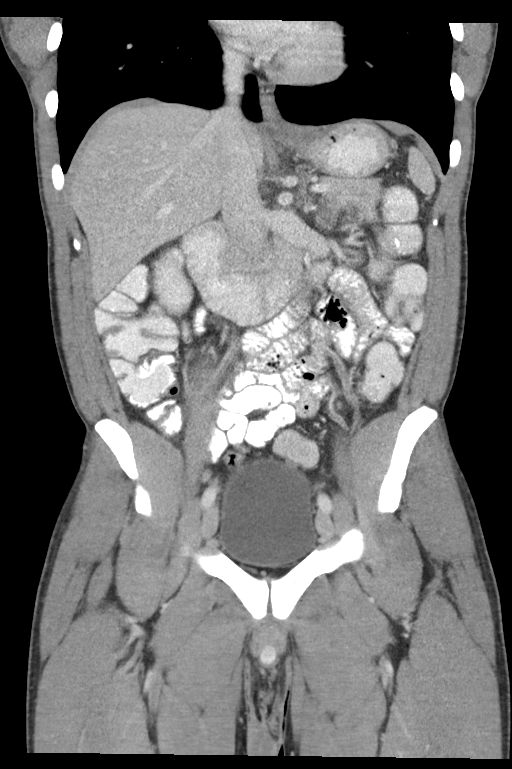
[im 44/79  soft-tissue]
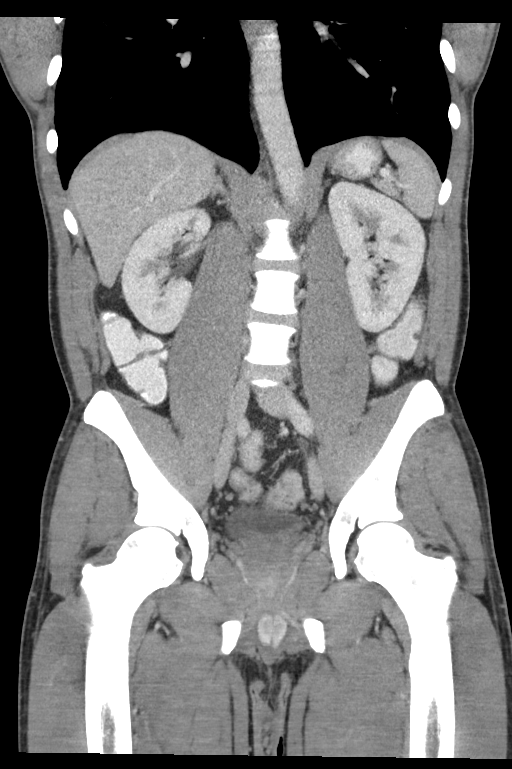

[15 of 46 positions shown; findings below may reference images not displayed]

FINDINGS: Lower chest: Visualized lung bases are clear.

Hepatobiliary: Liver demonstrates a normal contrast enhanced
appearance. Gallbladder within normal limits. No biliary dilatation.

Pancreas: Pancreas within normal limits.

Spleen: Spleen within normal limits.

Adrenals/Urinary Tract: Adrenal glands are normal. Kidneys equal
size with symmetric enhancement. Subcentimeter hypodensity within
the upper pole the right kidney too small the characterize by CT. No
nephrolithiasis, hydronephrosis or focal enhancing renal mass. No
visible hydroureter. Partially distended bladder within normal
limits.

Stomach/Bowel: Stomach within normal limits. No evidence for bowel
obstruction. Appendix within normal limits. No abnormal wall
thickening, mucosal enhancement, or inflammatory fat stranding seen
about the bowels. No complication related to recent hemorrhoidectomy
seen.

Vascular/Lymphatic: Normal intravascular enhancement seen throughout
the intra-abdominal aorta. Mesenteric vessels patent proximally. No
adenopathy.

Reproductive: Prostate and seminal vesicles within normal limits.

Other: No free air or fluid.

Musculoskeletal: No acute osseous abnormality. No discrete lytic or
blastic osseous lesions. Lumbar levoscoliosis noted.
IMPRESSION: No CT evidence for acute intra-abdominal or pelvic process. No
evidence for obstruction, or other complication related to recent
hemorrhoidectomy identified.

## 2021-07-13 ENCOUNTER — Encounter: Payer: Self-pay | Admitting: General Surgery

## 2021-11-01 ENCOUNTER — Emergency Department: Payer: Self-pay

## 2021-11-01 ENCOUNTER — Other Ambulatory Visit: Payer: Self-pay

## 2021-11-01 ENCOUNTER — Emergency Department
Admission: EM | Admit: 2021-11-01 | Discharge: 2021-11-01 | Disposition: A | Discharge status: Home / Self Care | Payer: Self-pay | Attending: Emergency Medicine | Admitting: Emergency Medicine

## 2021-11-01 DIAGNOSIS — M20019 Mallet finger of unspecified finger(s): Secondary | ICD-10-CM

## 2021-11-01 DIAGNOSIS — W2209XA Striking against other stationary object, initial encounter: Secondary | ICD-10-CM | POA: Insufficient documentation

## 2021-11-01 DIAGNOSIS — M79644 Pain in right finger(s): Secondary | ICD-10-CM | POA: Insufficient documentation

## 2021-11-01 DIAGNOSIS — S62656A Nondisplaced fracture of medial phalanx of right little finger, initial encounter for closed fracture: Secondary | ICD-10-CM | POA: Insufficient documentation

## 2021-11-01 DIAGNOSIS — S66909A Unspecified injury of unspecified muscle, fascia and tendon at wrist and hand level, unspecified hand, initial encounter: Secondary | ICD-10-CM

## 2021-11-01 DIAGNOSIS — K5900 Constipation, unspecified: Secondary | ICD-10-CM | POA: Insufficient documentation

## 2021-11-01 NOTE — Discharge Instructions (Addendum)
Call make an appointment with Dr. Odis Luster who is the orthopedist on-call at [facility] [place] about your finger.  The splint applied to your finger today is for protection however the tendon itself is what is causing your finger to drop and will not repair itself or heal without surgery.  X-rays of your abdomen does not show an obstruction but lots of gas.  You can obtain simethicone over-the-counter which will help with gas.  For constipation you may use MiraLAX mixed with 8 ounces of water followed by another 8 ounces of water and increase your intake of fruits and vegetables.  A list of clinics on your discharge paper are provided so that you may obtain a primary care provider.  You will need to call each office to see if they are taking new patients.

## 2021-11-01 NOTE — ED Provider Notes (Signed)
[facility] Provider Note    Event Date/Time   First MD Initiated Contact with Patient 11/01/21 1255     (approximate)   History   Back Pain, Constipation, and Finger Injury   HPI  Scott Smith is a 46 y.o. male   presents to the ED with complaint of right fifth finger pain after hitting a wall approximately 3 weeks ago.  Patient states he has been unable to fully extend his fifth digit since that time.  He is also here due to constipation.  Patient states he has not had a full bowel movement in the last 3 days but did have a small 1 this morning.  He is still able to pass gas.  He also wants documentation of his scoliosis because he wants to fall for disability.  Patient reports that he does not have a PCP.      Physical Exam   Triage Vital Signs: ED Triage Vitals [11/01/21 1135]  Enc Vitals Group     BP 132/90     Pulse Rate 77     Resp 17     Temp 98.5 F (36.9 C)     Temp Source Oral     SpO2 97 %     Weight 162 lb (73.5 kg)     Height 5\' 6"  (1.676 m)     Head Circumference      Peak Flow      Pain Score 3     Pain Loc      Pain Edu?      Excl. in [place]?     Most recent vital signs: Vitals:   11/01/21 1205 11/01/21 1435  BP: (!) 143/111 (!) 144/90  Pulse: 64 68  Resp: 16 16  Temp: 97.6 F (36.4 C)   SpO2: 98% 98%     General: Awake, no distress.  CV:  Good peripheral perfusion.  Heart regular rate and rhythm. Resp:  Normal effort.  Lungs are clear bilaterally. Abd:  No distention.  Abdomen soft, nontender, bowel sounds normoactive x4 quadrants. Other:  Examination of the right hand the distal digit is flexed.  There is moderate edema noted to the DIP joint.  Passively patient is able to fully extend his digit but immediately returns to the flexed position afterwards.  Skin is intact.  Capillary refill is less than 3 seconds.  Sensory function intact.   ED Results / Procedures / Treatments   Labs (all labs ordered are  listed, but only abnormal results are displayed) Labs Reviewed - No data to display   RADIOLOGY X-ray of the right fifth finger was reviewed by myself and noted to have a flexion of the distal phalanx most likely from an evulsion and tendon injury.  Radiology report was reviewed and suggests an avulsion causing the flexion of the distal phalanx right fifth finger.  Abdomen 1 film image was reviewed by myself i and negative for ileus or stool burden.  Radiology report reviewed and confirmed no stool burden but moderate mount of gas present.   PROCEDURES:  Critical Care performed: No  Procedures   MEDICATIONS ORDERED IN ED: Medications - No data to display   IMPRESSION / MDM / ASSESSMENT AND PLAN / ED COURSE  I reviewed the triage vital signs and the nursing notes.                              Differential  diagnosis includes, but is not limited to, right fifth finger fracture, right fifth finger tendon injury, constipation, ileus, obstruction.  46 year old male presents to the ED with complaint of injury to his right fifth finger approximately 3 weeks ago.  He has been unable able to fully extend his digits since that time.  X-ray shows a flexion of the distal phalanx most likely representing a avulsion/tendon injury of the extensor tendon.  This was splinted for protection and patient was made aware that he would need to see a orthopedist for repair of this.  Patient is right-hand dominant.  Also 1 view of his abdomen does not show any stool burden but lots of gas.  He is encouraged to obtain something like simethicone over-the-counter for the gas and MiraLAX if needed for constipation.  He was given a list of clinics in the area that charged based on income as he does not have a primary care provider and wants to pursue possible disability for chronic issues.        FINAL CLINICAL IMPRESSION(S) / ED DIAGNOSES   Final diagnoses:  Closed injury of tendon of finger excluding thumb  with mallet deformity  Constipation, unspecified constipation type     Rx / DC Orders   ED Discharge Orders     None        Note:  This document was prepared using Dragon voice recognition software and may include unintentional dictation errors.   Tommi Rumps, PA-C 11/01/21 1528    Delton [place], MD 11/02/21 [phone]

## 2021-11-01 NOTE — ED Triage Notes (Signed)
Patient to ER via POV w/ complaints of right 5th finger pain, constipation and back pain.   Reports punching a wall approx 3 weeks ago, states finger has been swollen and painful. Also reports no bowel movement for 3 days, reports still passing gas. Also states he has scoliosis, reports lower back pain due to increased movement at work and muscle spasms.

## 2022-01-09 ENCOUNTER — Emergency Department
Admission: EM | Admit: 2022-01-09 | Discharge: 2022-01-09 | Disposition: A | Discharge status: Home / Self Care | Payer: Medicaid Other | Attending: Emergency Medicine | Admitting: Emergency Medicine

## 2022-01-09 ENCOUNTER — Other Ambulatory Visit: Payer: Self-pay

## 2022-01-09 ENCOUNTER — Emergency Department: Payer: Medicaid Other

## 2022-01-09 DIAGNOSIS — M542 Cervicalgia: Secondary | ICD-10-CM | POA: Insufficient documentation

## 2022-01-09 MED ORDER — CYCLOBENZAPRINE HCL 10 MG PO TABS
10.0000 mg | ORAL_TABLET | Freq: Once | ORAL | Status: AC
Start: 2022-01-09 — End: 2022-01-09
  Administered 2022-01-09: 10 mg via ORAL
  Filled 2022-01-09: qty 1

## 2022-01-09 MED ORDER — CYCLOBENZAPRINE HCL 10 MG PO TABS: 10.0000 mg | ORAL_TABLET | Freq: Three times a day (TID) | ORAL | 0 refills | Status: AC | PRN

## 2022-01-09 MED ORDER — KETOROLAC TROMETHAMINE 30 MG/ML IJ SOLN
30.0000 mg | Freq: Once | INTRAMUSCULAR | Status: AC
  Administered 2022-01-09: 30 mg via INTRAMUSCULAR
  Filled 2022-01-09: qty 1

## 2022-01-09 MED ORDER — MELOXICAM 15 MG PO TABS: 15.0000 mg | ORAL_TABLET | Freq: Every day | ORAL | 2 refills | Status: AC

## 2022-01-09 NOTE — ED Provider Notes (Signed)
? ?[facility] ?Provider Note ? ? ? None  ?  (approximate) ? ? ?History  ? ?Back Pain ? ? ?HPI ? ?Scott Smith is a 46 y.o. male with history of scoliosis presents emergency department complaining of increased back pain.  Patient states it hurts more at the lower part of the neck.  No numbness or tingling.  No new injury.  Symptoms have been increasing over the past few days. ? ?  ? ? ?Physical Exam  ? ?Triage Vital Signs: ?ED Triage Vitals [01/09/22 0841]  ?Enc Vitals Group  ?   BP (!) 124/94  ?   Pulse Rate 77  ?   Resp 18  ?   Temp 98.1 ?F (36.7 ?C)  ?   Temp src   ?   SpO2 98 %  ?   Weight   ?   Height   ?   Head Circumference   ?   Peak Flow   ?   Pain Score   ?   Pain Loc   ?   Pain Edu?   ?   Excl. in [place]?   ? ? ?Most recent vital signs: ?Vitals:  ? 01/09/22 0841  ?BP: (!) 124/94  ?Pulse: 77  ?Resp: 18  ?Temp: 98.1 ?F (36.7 ?C)  ?SpO2: 98%  ? ? ? ?General: Awake, no distress.   ?CV:  Good peripheral perfusion. regular rate and  rhythm ?Resp:  Normal effort.  ?Abd:  No distention.   ?Other:  C-spine mildly tender around C5/C6 ? ? ?ED Results / Procedures / Treatments  ? ?Labs ?(all labs ordered are listed, but only abnormal results are displayed) ?Labs Reviewed - No data to display ? ? ?EKG ? ? ? ? ?RADIOLOGY ?X-ray C-spine ? ? ? ?PROCEDURES: ? ? ?Procedures ? ? ?MEDICATIONS ORDERED IN ED: ?Medications  ?cyclobenzaprine (FLEXERIL) tablet 10 mg (10 mg Oral Given 01/09/22 0901)  ?ketorolac (TORADOL) 30 MG/ML injection 30 mg (30 mg Intramuscular Given 01/09/22 0900)  ? ? ? ?IMPRESSION / MDM / ASSESSMENT AND PLAN / ED COURSE  ?I reviewed the triage vital signs and the nursing notes. ?             ?               ? ?Differential diagnosis includes, but is not limited to, cervical strain, arthritis, facet syndrome ? ?Patient was given Toradol 30 mg IM and Flexeril 10 mg p.o. ? ?X-ray of the C-spine shows degenerative changes only.  No acute abnormality.  This was independently reviewed by me  and confirmed by radiology. ? ?I did explain the findings to the patient.  He was given a prescription for meloxicam and Flexeril.  He is to follow-up this [facility] if not improving in 5 to 7 days.  He was given a work note and discharged stable condition.  Patient is in agreement treatment plan. ? ? ? ? ?  ? ? ?FINAL CLINICAL IMPRESSION(S) / ED DIAGNOSES  ? ?Final diagnoses:  ?Neck pain  ? ? ? ?Rx / DC Orders  ? ?ED Discharge Orders   ? ?      Ordered  ?  meloxicam (MOBIC) 15 MG tablet  Daily       ? 01/09/22 0936  ?  cyclobenzaprine (FLEXERIL) 10 MG tablet  3 times daily PRN       ? 01/09/22 0936  ? ?  ?  ? ?  ? ? ? ?  Note:  This document was prepared using Dragon voice recognition software and may include unintentional dictation errors. ? ?  ?Versie Starks, PA-C ?01/09/22 [phone] ? ?  ?Delman Kitten, MD ?01/09/22 1533 ? ?

## 2022-01-09 NOTE — ED Notes (Signed)
Patient transported to X-ray 

## 2022-01-09 NOTE — ED Triage Notes (Signed)
Pt come with c/o back pain for over week now. Pt states hx of scoliosis. Pt denies any recent injuries. ?
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
## 2022-01-09 NOTE — ED Notes (Signed)
Back from X/R.

## 2022-01-09 NOTE — Discharge Instructions (Signed)
Follow-up with Dr. Allena Katz at [facility] if not improving in 1 week.  Return emergency department worsening.  Take medication as prescribed. ?

## 2022-04-12 ENCOUNTER — Other Ambulatory Visit: Payer: Self-pay

## 2022-04-12 ENCOUNTER — Emergency Department: Payer: 59

## 2022-04-12 ENCOUNTER — Emergency Department
Admission: EM | Admit: 2022-04-12 | Discharge: 2022-04-12 | Disposition: A | Discharge status: Home / Self Care | Payer: 59 | Attending: Emergency Medicine | Admitting: Emergency Medicine

## 2022-04-12 ENCOUNTER — Emergency Department
Admission: EM | Admit: 2022-04-12 | Discharge: 2022-04-12 | Discharge status: Against Medical Advice | Payer: 59 | Attending: Family Medicine | Admitting: Family Medicine

## 2022-04-12 DIAGNOSIS — Y990 Civilian activity done for income or pay: Secondary | ICD-10-CM | POA: Diagnosis not present

## 2022-04-12 DIAGNOSIS — W208XXA Other cause of strike by thrown, projected or falling object, initial encounter: Secondary | ICD-10-CM | POA: Insufficient documentation

## 2022-04-12 DIAGNOSIS — R69 Illness, unspecified: Secondary | ICD-10-CM | POA: Diagnosis not present

## 2022-04-12 DIAGNOSIS — S01412A Laceration without foreign body of left cheek and temporomandibular area, initial encounter: Secondary | ICD-10-CM | POA: Insufficient documentation

## 2022-04-12 DIAGNOSIS — R58 Hemorrhage, not elsewhere classified: Secondary | ICD-10-CM | POA: Diagnosis not present

## 2022-04-12 DIAGNOSIS — S0081XA Abrasion of other part of head, initial encounter: Secondary | ICD-10-CM | POA: Diagnosis not present

## 2022-04-12 DIAGNOSIS — Z5321 Procedure and treatment not carried out due to patient leaving prior to being seen by health care provider: Secondary | ICD-10-CM | POA: Diagnosis not present

## 2022-04-12 DIAGNOSIS — Z23 Encounter for immunization: Secondary | ICD-10-CM | POA: Insufficient documentation

## 2022-04-12 DIAGNOSIS — S0990XA Unspecified injury of head, initial encounter: Secondary | ICD-10-CM | POA: Diagnosis present

## 2022-04-12 DIAGNOSIS — Z743 Need for continuous supervision: Secondary | ICD-10-CM | POA: Diagnosis not present

## 2022-04-12 DIAGNOSIS — S0181XA Laceration without foreign body of other part of head, initial encounter: Secondary | ICD-10-CM

## 2022-04-12 DIAGNOSIS — W3182XA Contact with other commercial machinery, initial encounter: Secondary | ICD-10-CM | POA: Insufficient documentation

## 2022-04-12 DIAGNOSIS — S0103XA Puncture wound without foreign body of scalp, initial encounter: Secondary | ICD-10-CM | POA: Insufficient documentation

## 2022-04-12 DIAGNOSIS — T1490XA Injury, unspecified, initial encounter: Secondary | ICD-10-CM | POA: Diagnosis not present

## 2022-04-12 MED ORDER — CEPHALEXIN 500 MG PO CAPS: 500.0000 mg | ORAL_CAPSULE | Freq: Two times a day (BID) | ORAL | 0 refills | Status: AC

## 2022-04-12 MED ORDER — TETANUS-DIPHTH-ACELL PERTUSSIS 5-2.5-18.5 LF-MCG/0.5 IM SUSY
0.5000 mL | PREFILLED_SYRINGE | Freq: Once | INTRAMUSCULAR | Status: AC
  Administered 2022-04-12: 0.5 mL via INTRAMUSCULAR
  Filled 2022-04-12: qty 0.5

## 2022-04-12 MED ORDER — LIDOCAINE-EPINEPHRINE 2 %-1:100000 IJ SOLN
20.0000 mL | Freq: Once | INTRAMUSCULAR | Status: AC
  Administered 2022-04-12: 20 mL via INTRADERMAL
  Filled 2022-04-12: qty 1

## 2022-04-12 NOTE — ED Triage Notes (Addendum)
Pt to ED via POV from work. Pt was working with power tools when the tool got away from him and lacerated his right cheek (~6 inches long) and right side of forehead (~2-3 inches long). Pt doesn't recall what he was doing or what tools were being used. Triage RN and NP attempting to control blood at this time. Lacerations rewrapped and pt given ice pack to hold pressure on lacerations. Pt denies blood thinners.

## 2022-04-12 NOTE — ED Notes (Signed)
Report received from Bill, RN 
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
## 2022-04-12 NOTE — ED Notes (Signed)
Dr. Fanny Bien at the bedside to suture pt's face

## 2022-04-12 NOTE — ED Provider Triage Note (Signed)
Emergency Medicine Provider Triage Evaluation Note  Scott Smith , a 46 y.o. male  was evaluated in triage.  Pt complains of facial laceration. He reports he was working with some power tools and the tool got away from him and cut his right cheek and forehead. No loss of consciousness. Patient's story varies and also states that something sharp fell from somewhere and cut his face.   Physical Exam  BP (!) 162/116 (BP Location: Right Arm)   Pulse (!) 113   Temp 98.4 F (36.9 C) (Oral)   Resp 18   SpO2 100%  Gen:   Awake, no distress   Resp:  Normal effort  MSK:   Moves extremities without difficulty  Other:  Laceration from under right eye, through beard, and extends to lower jaw.   Medical Decision Making  Medically screening exam initiated at 4:47 PM.  Appropriate orders placed.  Scott Smith was informed that the remainder of the evaluation will be completed by another provider, this initial triage assessment does not replace that evaluation, and the importance of remaining in the ED until their evaluation is complete.  Patient unable to give clear explanation of injury. Laceration extends from just under right eye, through beard and extends to lower jaw. Concern for more traumatic injury than simple cut from a tool. CTs ordered.   Chinita Pester, FNP 04/12/22 1704

## 2022-04-12 NOTE — ED Triage Notes (Signed)
Patient arrived via EMS for a second time, after eloping the first time he was brought in. Pt has multiple lacerations to the right side of his face due to having an accident with some type of power tool. Lacerations are actively bleeding. Pt has ETOH on board and cannot remember what kind of tool he was using.

## 2022-04-12 NOTE — ED Provider Notes (Signed)
[facility] Provider Note    Event Date/Time   First MD Initiated Contact with Patient 04/12/22 1908     (approximate)   History   Facial laceration  HPI  Scott Smith is a 46 y.o. male reports he was struck with a tool while working under a car.  The car did not fall or anything.  He reports a 2/10 from around the right cheek below the right eye and the the area around the right face and jaw.  Denies allergies to anything.  Denies eye injury.  Normal vision.  Has also a very small punctate wound on the right top of the forehead as well but he reports not sure maybe somehow that scratched him too.  Patient is not able to provide a great description as to how this happened but he is very clear that no one attacked him.  He reports that this was nonintentional and that he was simply working on her car when something slashed him across the face from a power to  He had a CT scan.  He left the ER was walking home and then started bleeding source recall paramedics.  Further injury  No neck pain.     Physical Exam   Triage Vital Signs: ED Triage Vitals  Enc Vitals Group     BP      Pulse      Resp      Temp      Temp src      SpO2      Weight      Height      Head Circumference      Peak Flow      Pain Score      Pain Loc      Pain Edu?      Excl. in GC?     Most recent vital signs: Vitals:   04/12/22 1928  BP: (!) 149/102  Pulse: 85  Resp: (!) 22  Temp: 98.1 F (36.7 C)  SpO2: 97%     General: Awake, no distress.  Normocephalic atraumatic except he has a teeny less than a millimeter or so very punctate small area of abrasion versus very superficial puncture wound over the right frontal scalp without bleeding. Also see clear clinical media upload.  From the area over the maxilla and just below the eye and eyelid he has a rather linear laceration running the course from here diagonally down to the beard.  Approximately the first 1  and half centimeters of which is deep with some muscular tissue showing.  Examined carefully irrigated and washed out and no foreign body is found.  Additionally the remainder of this laceration which runs probably another 5 cm is extremely superficial and appears to be more of a deep abrasion and is not closable as it is too superficial.  Bleeding is well controlled CV:  Good peripheral perfusion.  Resp:  Normal effort.  Abd:  No distention.  Other:  No remaining puncture wounds or injuries on visual examination of the chest abdomen pelvis and back.  Denies any injuries to his extremities and no noted injuries to the extremities.  Extraocular movements are normal.  He reports normal vision and no pain involving either eye.  There is no evidence of injury to the ocular region or the eye.  There is no neck injury.  There is no injury or puncture wound or otherwise involving the neck surfaces cortisones of the neck.  His work of breathing is normal   ED Results / Procedures / Treatments   Labs (all labs ordered are listed, but only abnormal results are displayed) Labs Reviewed - No data to display   EKG     RADIOLOGY CT Head Wo Contrast  Result Date: 04/12/2022 CLINICAL DATA:  Polytrauma, blunt EXAM: CT HEAD WITHOUT CONTRAST CT MAXILLOFACIAL WITHOUT CONTRAST CT CERVICAL SPINE WITHOUT CONTRAST TECHNIQUE: Multidetector CT imaging of the head, cervical spine, and maxillofacial structures were performed using the standard protocol without intravenous contrast. Multiplanar CT image reconstructions of the cervical spine and maxillofacial structures were also generated. RADIATION DOSE REDUCTION: This exam was performed according to the departmental dose-optimization program which includes automated exposure control, adjustment of the mA and/or kV according to patient size and/or use of iterative reconstruction technique. COMPARISON:  CT head and CT cervical spine December 20, 2019. CT maxillofacial  December 23, 2019. FINDINGS: CT HEAD FINDINGS Brain: No evidence of acute infarction, hemorrhage, hydrocephalus, extra-axial collection or mass lesion/mass effect. Vascular: No hyperdense vessel identified. Skull: No acute fracture. Other: No mastoid effusions. CT MAXILLOFACIAL FINDINGS Osseous: No acute fracture or mandibular dislocation. Deformity of the nasal bones appears chronic and similar to the prior. Orbits: Negative. No traumatic or inflammatory finding. Sinuses: Clear. Soft tissues: No acute intracranial abnormality right cheek contusion/laceration. Punctate superficial density in this region may represent a tiny foreign body (for example see series 6 image 30 and series 3, image 43). CT CERVICAL SPINE FINDINGS Alignment: Similar straightening. No substantial sagittal subluxation. Skull base and vertebrae: Vertebral body heights are maintained. No evidence of acute fracture. Soft tissues and spinal canal: No prevertebral fluid or swelling. No visible canal hematoma. Disc levels: Moderate multilevel degenerative disease including disc height loss and endplate spurring. Multilevel facet and uncovertebral hypertrophy with varying degrees of neural foraminal stenosis. Upper chest: Biapical pleuroparenchymal scarring. Otherwise, visualized lung apices are clear. IMPRESSION: 1. No acute intracranial abnormality or acute facial fracture. 2. Right cheek contusion/laceration. Punctate superficial density in this region may represent a tiny foreign body. 3. No acute fracture or traumatic malalignment the cervical spine. Electronically Signed   By: Feliberto Harts M.D.   On: 04/12/2022 17:31   CT Maxillofacial Wo Contrast  Result Date: 04/12/2022 CLINICAL DATA:  Polytrauma, blunt EXAM: CT HEAD WITHOUT CONTRAST CT MAXILLOFACIAL WITHOUT CONTRAST CT CERVICAL SPINE WITHOUT CONTRAST TECHNIQUE: Multidetector CT imaging of the head, cervical spine, and maxillofacial structures were performed using the standard protocol  without intravenous contrast. Multiplanar CT image reconstructions of the cervical spine and maxillofacial structures were also generated. RADIATION DOSE REDUCTION: This exam was performed according to the departmental dose-optimization program which includes automated exposure control, adjustment of the mA and/or kV according to patient size and/or use of iterative reconstruction technique. COMPARISON:  CT head and CT cervical spine December 20, 2019. CT maxillofacial December 23, 2019. FINDINGS: CT HEAD FINDINGS Brain: No evidence of acute infarction, hemorrhage, hydrocephalus, extra-axial collection or mass lesion/mass effect. Vascular: No hyperdense vessel identified. Skull: No acute fracture. Other: No mastoid effusions. CT MAXILLOFACIAL FINDINGS Osseous: No acute fracture or mandibular dislocation. Deformity of the nasal bones appears chronic and similar to the prior. Orbits: Negative. No traumatic or inflammatory finding. Sinuses: Clear. Soft tissues: No acute intracranial abnormality right cheek contusion/laceration. Punctate superficial density in this region may represent a tiny foreign body (for example see series 6 image 30 and series 3, image 43). CT CERVICAL SPINE FINDINGS Alignment: Similar straightening. No substantial sagittal subluxation. Skull base and  vertebrae: Vertebral body heights are maintained. No evidence of acute fracture. Soft tissues and spinal canal: No prevertebral fluid or swelling. No visible canal hematoma. Disc levels: Moderate multilevel degenerative disease including disc height loss and endplate spurring. Multilevel facet and uncovertebral hypertrophy with varying degrees of neural foraminal stenosis. Upper chest: Biapical pleuroparenchymal scarring. Otherwise, visualized lung apices are clear. IMPRESSION: 1. No acute intracranial abnormality or acute facial fracture. 2. Right cheek contusion/laceration. Punctate superficial density in this region may represent a tiny foreign body. 3.  No acute fracture or traumatic malalignment the cervical spine. Electronically Signed   By: Feliberto Harts M.D.   On: 04/12/2022 17:31   CT Cervical Spine Wo Contrast  Result Date: 04/12/2022 CLINICAL DATA:  Polytrauma, blunt EXAM: CT HEAD WITHOUT CONTRAST CT MAXILLOFACIAL WITHOUT CONTRAST CT CERVICAL SPINE WITHOUT CONTRAST TECHNIQUE: Multidetector CT imaging of the head, cervical spine, and maxillofacial structures were performed using the standard protocol without intravenous contrast. Multiplanar CT image reconstructions of the cervical spine and maxillofacial structures were also generated. RADIATION DOSE REDUCTION: This exam was performed according to the departmental dose-optimization program which includes automated exposure control, adjustment of the mA and/or kV according to patient size and/or use of iterative reconstruction technique. COMPARISON:  CT head and CT cervical spine December 20, 2019. CT maxillofacial December 23, 2019. FINDINGS: CT HEAD FINDINGS Brain: No evidence of acute infarction, hemorrhage, hydrocephalus, extra-axial collection or mass lesion/mass effect. Vascular: No hyperdense vessel identified. Skull: No acute fracture. Other: No mastoid effusions. CT MAXILLOFACIAL FINDINGS Osseous: No acute fracture or mandibular dislocation. Deformity of the nasal bones appears chronic and similar to the prior. Orbits: Negative. No traumatic or inflammatory finding. Sinuses: Clear. Soft tissues: No acute intracranial abnormality right cheek contusion/laceration. Punctate superficial density in this region may represent a tiny foreign body (for example see series 6 image 30 and series 3, image 43). CT CERVICAL SPINE FINDINGS Alignment: Similar straightening. No substantial sagittal subluxation. Skull base and vertebrae: Vertebral body heights are maintained. No evidence of acute fracture. Soft tissues and spinal canal: No prevertebral fluid or swelling. No visible canal hematoma. Disc levels:  Moderate multilevel degenerative disease including disc height loss and endplate spurring. Multilevel facet and uncovertebral hypertrophy with varying degrees of neural foraminal stenosis. Upper chest: Biapical pleuroparenchymal scarring. Otherwise, visualized lung apices are clear. IMPRESSION: 1. No acute intracranial abnormality or acute facial fracture. 2. Right cheek contusion/laceration. Punctate superficial density in this region may represent a tiny foreign body. 3. No acute fracture or traumatic malalignment the cervical spine. Electronically Signed   By: Feliberto Harts M.D.   On: 04/12/2022 17:31    There is mention of a very small punctate superficial density around the right cheek contusion and laceration, on examination and careful inspection I did not find foreign body.  I did inform the patient of the risk of foreign body retention, but none is found on exam none is seen on cleansing and irrigation    PROCEDURES:  Critical Care performed: No  ..Laceration Repair  Date/Time: 04/12/2022 7:45 PM  Performed by: Sharyn Creamer, MD Authorized by: Sharyn Creamer, MD   Consent:    Consent obtained:  Verbal   Consent given by:  Patient   Risks, benefits, and alternatives were discussed: yes     Risks discussed:  Infection, pain, retained foreign body, poor cosmetic result and poor wound healing Universal protocol:    Procedure explained and questions answered to patient or proxy's satisfaction: yes     Test results  available: yes     Imaging studies available: yes     Patient identity confirmed:  Verbally with patient Anesthesia:    Anesthesia method:  Local infiltration   Local anesthetic:  Lidocaine 2% WITH epi (3 ml) Laceration details:    Location:  Face   Length (cm):  2   Depth (mm):  5 Pre-procedure details:    Preparation:  Patient was prepped and draped in usual sterile fashion and imaging obtained to evaluate for foreign bodies Exploration:    Hemostasis achieved with:   Direct pressure   Wound exploration: wound explored through full range of motion     Wound extent: no fascia violation noted, no foreign bodies/material noted, no muscle damage noted, no nerve damage noted, no tendon damage noted, no underlying fracture noted and no vascular damage noted     Contaminated: no   Treatment:    Area cleansed with:  Povidone-iodine   Amount of cleaning:  Extensive   Irrigation solution:  Sterile saline   Irrigation volume:  Extensive scrub and iodine solution   Visualized foreign bodies/material removed: no   Skin repair:    Repair method:  Sutures   Suture size:  3-0   Suture material:  Prolene   Suture technique:  Simple interrupted   Number of sutures:  5 Approximation:    Approximation:  Close Repair type:    Repair type:  Simple Post-procedure details:    Dressing:  Sterile dressing   Procedure completion:  Tolerated well, no immediate complications    MEDICATIONS ORDERED IN ED: Medications  lidocaine-EPINEPHrine (XYLOCAINE W/EPI) 2 %-1:100000 (with pres) injection 20 mL (20 mLs Intradermal Given 04/12/22 1933)  Tdap (BOOSTRIX) injection 0.5 mL (0.5 mLs Intramuscular Given 04/12/22 1941)     IMPRESSION / MDM / ASSESSMENT AND PLAN / ED COURSE  I reviewed the triage vital signs and the nursing notes.                              Differential diagnosis includes, but is not limited to, facial injury and laceration, abrasion, intracranial hemorrhage or other closed head injury  Patient adamantly reports that he was working on his car when a tool slipped and come across the face, this is entirely possible, but denies history of the injury is questionable as to whether or not this could have been an assault type injury but the patient vehemently denies this.  Nonetheless, Tdap administered patient reports several years since having a tetanus.  Prophylactic antibiotic written for given the injury and the unknown tool that may have been used or if it  could have been due to dirty or contaminated.  Laceration repaired with excellent effect good bleeding control.  Patient fully alert oriented no evidence of neck stone injury or other deeper or penetrating injury.  CT imaging reassuring without evidence of intracranial hemorrhage or cervical injury.  Patient's presentation is most consistent with acute complicated illness / injury requiring diagnostic workup.   ----------------------------------------- 7:48 PM on 04/12/2022 -----------------------------------------  Return precautions and treatment recommendations and follow-up discussed with the patient who is agreeable with the plan.     FINAL CLINICAL IMPRESSION(S) / ED DIAGNOSES   Final diagnoses:  Facial laceration, initial encounter  Abrasion of face, initial encounter     Rx / DC Orders   ED Discharge Orders          Ordered    cephALEXin (KEFLEX) 500 MG capsule  2 times daily        04/12/22 1941             Note:  This document was prepared using Dragon voice recognition software and may include unintentional dictation errors.   Sharyn CreamerQuale, Rehaan Viloria, MD 04/12/22 [phone]

## 2022-04-18 ENCOUNTER — Other Ambulatory Visit: Payer: Self-pay

## 2022-04-18 ENCOUNTER — Emergency Department
Admission: EM | Admit: 2022-04-18 | Discharge: 2022-04-18 | Disposition: A | Discharge status: Home / Self Care | Payer: 59 | Attending: Emergency Medicine | Admitting: Emergency Medicine

## 2022-04-18 DIAGNOSIS — Z4802 Encounter for removal of sutures: Secondary | ICD-10-CM | POA: Insufficient documentation

## 2022-04-18 DIAGNOSIS — S0181XD Laceration without foreign body of other part of head, subsequent encounter: Secondary | ICD-10-CM | POA: Diagnosis not present
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
## 2022-04-18 NOTE — Discharge Instructions (Signed)
Keep the area clean and dry.  Wash with soap and water.  Return for any new, worsening, or change in symptoms or other concerns.  It was a pleasure caring for you today.

## 2022-04-18 NOTE — ED Triage Notes (Signed)
Pt is here for suture removal from wound to the right side of face, healing well

## 2022-05-23 ENCOUNTER — Other Ambulatory Visit: Payer: Self-pay

## 2022-05-23 ENCOUNTER — Emergency Department: Payer: Self-pay

## 2022-05-23 ENCOUNTER — Emergency Department
Admission: EM | Admit: 2022-05-23 | Discharge: 2022-05-23 | Disposition: A | Discharge status: Home / Self Care | Payer: Self-pay | Attending: Emergency Medicine | Admitting: Emergency Medicine

## 2022-05-23 DIAGNOSIS — R079 Chest pain, unspecified: Secondary | ICD-10-CM

## 2022-05-23 DIAGNOSIS — F1410 Cocaine abuse, uncomplicated: Secondary | ICD-10-CM | POA: Insufficient documentation

## 2022-05-23 DIAGNOSIS — I517 Cardiomegaly: Secondary | ICD-10-CM | POA: Insufficient documentation

## 2022-05-23 LAB — CBC
HCT: 46.2 % (ref 39.0–52.0)
Hemoglobin: 15.6 g/dL (ref 13.0–17.0)
MCH: 30.7 pg (ref 26.0–34.0)
MCHC: 33.8 g/dL (ref 30.0–36.0)
MCV: 90.9 fL (ref 80.0–100.0)
Platelets: 348 10*3/uL (ref 150–400)
RBC: 5.08 MIL/uL (ref 4.22–5.81)
RDW: 13.2 % (ref 11.5–15.5)
WBC: 5.6 10*3/uL (ref 4.0–10.5)
nRBC: 0 % (ref 0.0–0.2)

## 2022-05-23 LAB — BASIC METABOLIC PANEL
Anion gap: 11 (ref 5–15)
BUN: 10 mg/dL (ref 6–20)
CO2: 27 mmol/L (ref 22–32)
Calcium: 9.7 mg/dL (ref 8.9–10.3)
Chloride: 100 mmol/L (ref 98–111)
Creatinine, Ser: 1.16 mg/dL (ref 0.61–1.24)
GFR, Estimated: >60 mL/min (ref >60)
Glucose, Bld: 88 mg/dL (ref 70–99)
Potassium: 3.5 mmol/L (ref 3.5–5.1)
Sodium: 138 mmol/L (ref 135–145)

## 2022-05-23 LAB — TROPONIN I (HIGH SENSITIVITY): Troponin I (High Sensitivity): 7 ng/L (ref <18)

## 2022-05-23 NOTE — ED Provider Notes (Signed)
[facility] Provider Note   Event Date/Time   First MD Initiated Contact with Patient 05/23/22 1324     (approximate) History  Chest Pain  HPI Scott Smith is a 46 y.o. male with a past medical history of cocaine abuse who presents for substernal chest pain that began today approximately 3 hours prior to arrival as prescribed as midsternal, nonradiating, 4/10 in severity.  Patient denies any history of similar symptoms.  Patient does state that he has been doing cocaine over the past 2 days. ROS: Patient currently denies any vision changes, tinnitus, difficulty speaking, facial droop, sore throat, shortness of breath, abdominal pain, nausea/vomiting/diarrhea, dysuria, or weakness/numbness/paresthesias in any extremity   Physical Exam  Triage Vital Signs: ED Triage Vitals  Enc Vitals Group     BP 05/23/22 1323 (!) 177/109     Pulse Rate 05/23/22 1323 73     Resp 05/23/22 1323 20     Temp 05/23/22 1323 97.8 F (36.6 C)     Temp src --      SpO2 05/23/22 1323 100 %     Weight --      Height --      Head Circumference --      Peak Flow --      Pain Score 05/23/22 1321 4     Pain Loc --      Pain Edu? --      Excl. in GC? --    Most recent vital signs: Vitals:   05/23/22 1323  BP: (!) 177/109  Pulse: 73  Resp: 20  Temp: 97.8 F (36.6 C)  SpO2: 100%   General: Awake, oriented x4. CV:  Good peripheral perfusion.  Resp:  Normal effort.  Abd:  No distention.  Other:   ED Results / Procedures / Treatments  Labs (all labs ordered are listed, but only abnormal results are displayed) Labs Reviewed  BASIC METABOLIC PANEL  CBC  COMPREHENSIVE METABOLIC PANEL  URINE DRUG SCREEN, QUALITATIVE ([facility] ONLY)  URINALYSIS, ROUTINE W REFLEX MICROSCOPIC  TROPONIN I (HIGH SENSITIVITY)  TROPONIN I (HIGH SENSITIVITY)   EKG ED ECG REPORT I, Merwyn Katos, the attending physician, personally viewed and interpreted this ECG. Date: 05/23/2022 EKG Time:  1322 Rate: 65 Rhythm: normal sinus rhythm QRS Axis: normal Intervals: normal ST/T Wave abnormalities: normal Narrative Interpretation: no evidence of acute ischemia RADIOLOGY ED MD interpretation: 2 view chest x-ray interpreted by me shows no evidence of acute abnormalities including no pneumonia, pneumothorax, or widened mediastinum -Agree with radiology assessment Official radiology report(s): DG Chest 2 View  Result Date: 05/23/2022 CLINICAL DATA:  cp EXAM: CHEST - 2 VIEW COMPARISON:  Chest x-ray May 9 21. FINDINGS: Patient rotation. No visible consolidation. No visible pleural effusions or pneumothorax. When accounting for patient rotation, cardiomediastinal silhouette is within normal limits. No evidence of acute osseous abnormality. Scoliosis. IMPRESSION: No evidence of acute cardiopulmonary disease. Electronically Signed   By: Feliberto Harts M.D.   On: 05/23/2022 13:59   PROCEDURES: Critical Care performed: No .1-3 Lead EKG Interpretation  Performed by: Merwyn Katos, MD Authorized by: Merwyn Katos, MD     Interpretation: normal     ECG rate:  74   ECG rate assessment: normal     Rhythm: sinus rhythm     Ectopy: none     Conduction: normal    MEDICATIONS ORDERED IN ED: Medications - No data to display IMPRESSION / MDM / ASSESSMENT AND PLAN / ED  COURSE  I reviewed the triage vital signs and the nursing notes.                             The patient is on the cardiac monitor to evaluate for evidence of arrhythmia and/or significant heart rate changes. Patient's presentation is most consistent with acute presentation with potential threat to life or bodily function. Workup: ECG, CXR, CBC, BMP, Troponin Findings: ECG: No overt evidence of STEMI. No evidence of Brugadas sign, delta wave, epsilon wave, significantly prolonged QTc, or malignant arrhythmia HS Troponin: Negative x1 Other Labs unremarkable for emergent problems. CXR: Without PTX, PNA, or widened  mediastinum Last Stress Test:  never Last Heart Catheterization:  never HEART Score: 2  Given History, Exam, and Workup I have low suspicion for ACS, Pneumothorax, Pneumonia, Pulmonary Embolus, Tamponade, Aortic Dissection or other emergent problem as a cause for this presentation.   Reassesment: Prior to discharge patients pain was controlled and they were well appearing.  Disposition:  Discharge. Strict return precautions discussed with patient with full understanding. Advised patient to follow up promptly with primary care provider    FINAL CLINICAL IMPRESSION(S) / ED DIAGNOSES   Final diagnoses:  Chest pain, unspecified type  Mild cardiomegaly  Cocaine abuse (HCC)   Rx / DC Orders   ED Discharge Orders          Ordered    Ambulatory referral to Cardiology       Comments: If you have not heard from the Cardiology office within the next 72 hours please call [phone].   05/23/22 1538           Note:  This document was prepared using Dragon voice recognition software and may include unintentional dictation errors.   Merwyn Katos, MD 05/23/22 [phone]

## 2022-05-23 NOTE — ED Triage Notes (Signed)
Pt comes via EMs with c/o CP that started today. Pt states mid sternal pain. Pt states 3-10 pain. Pt denies any hx of this. Pt states two days ago of sniffing coke. Pt states usage of about 1x a week.   EMS gave 324 aspirin 1 spray of nitro IV in place  BP-165/115

## 2022-08-21 ENCOUNTER — Ambulatory Visit: Payer: Self-pay | Admitting: Gerontology

## 2022-08-21 DIAGNOSIS — Z7689 Persons encountering health services in other specified circumstances: Secondary | ICD-10-CM | POA: Insufficient documentation

## 2023-07-04 IMAGING — DX DG ABDOMEN 1V
1 series · 1 of 1 positions shown · non-contrast
Comparison: 02/20/2020

CLINICAL DATA: Constipation

EXAM:
ABDOMEN - 1 VIEW

[abdomen supine]
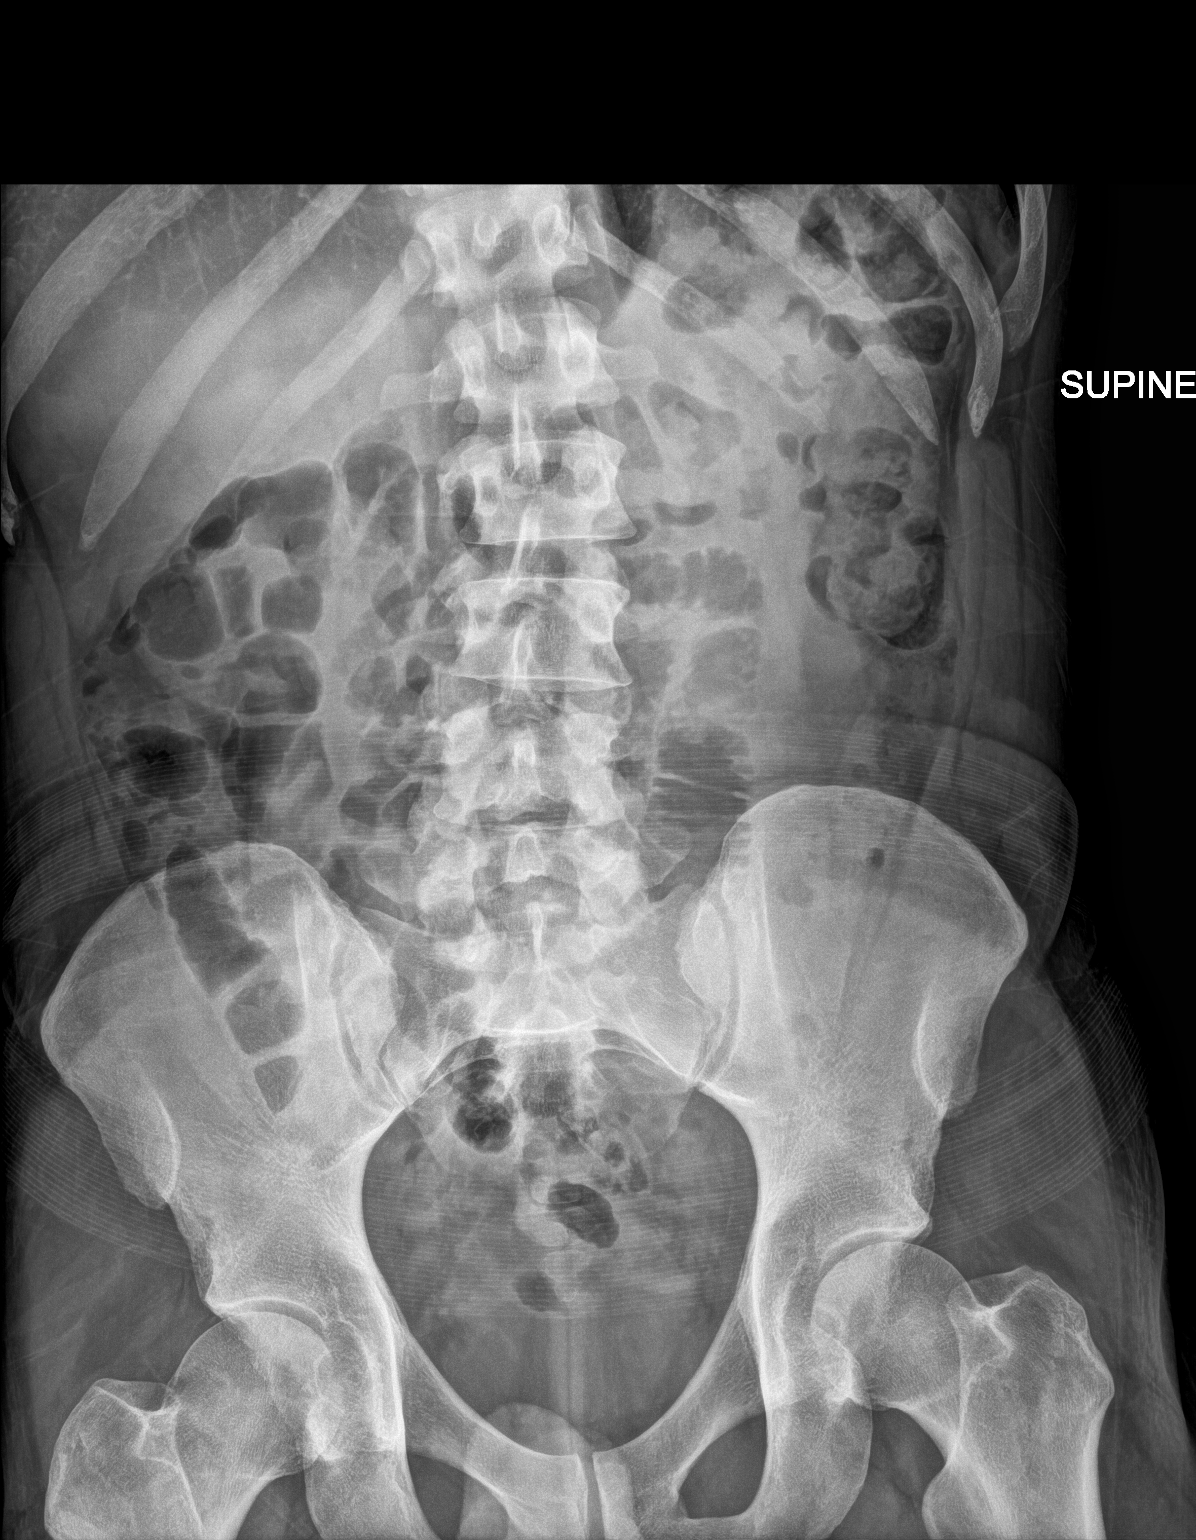

[1 of 1 positions shown; findings below may reference images not displayed]

FINDINGS: Supine frontal view of the abdomen and pelvis demonstrates an
unremarkable bowel gas pattern. No obstruction or ileus. No
significant fecal retention. No masses or abnormal calcifications.
Mild left convex scoliosis centered at L1-2.
IMPRESSION: 1. Unremarkable bowel gas pattern.

## 2023-07-04 IMAGING — CR DG FINGER LITTLE 2+V*R*
3 series · 3 of 3 positions shown · non-contrast
Comparison: None.

CLINICAL DATA: Little finger injury 2 weeks ago. Finger is stuck in
flexed position at distal interphalangeal joint following surgery.

EXAM:
RIGHT LITTLE FINGER 2+V

[finger ap]
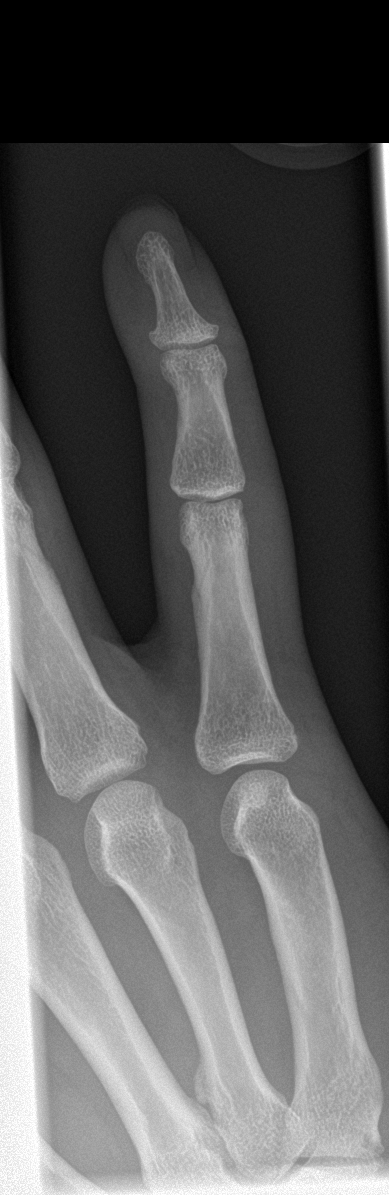

[finger obl]
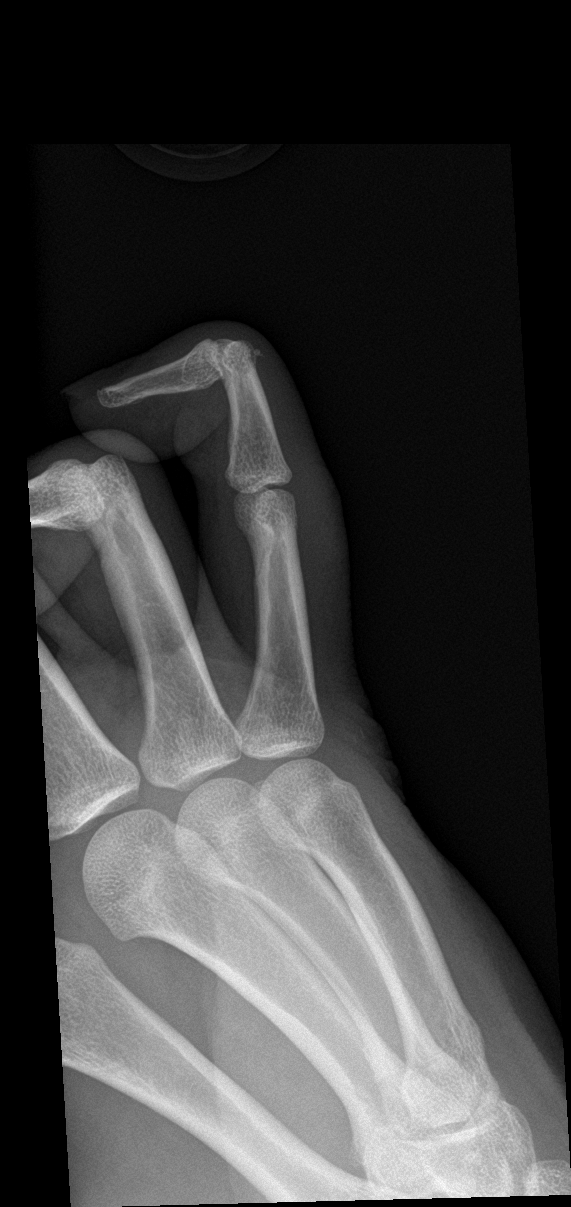

[finger lat]
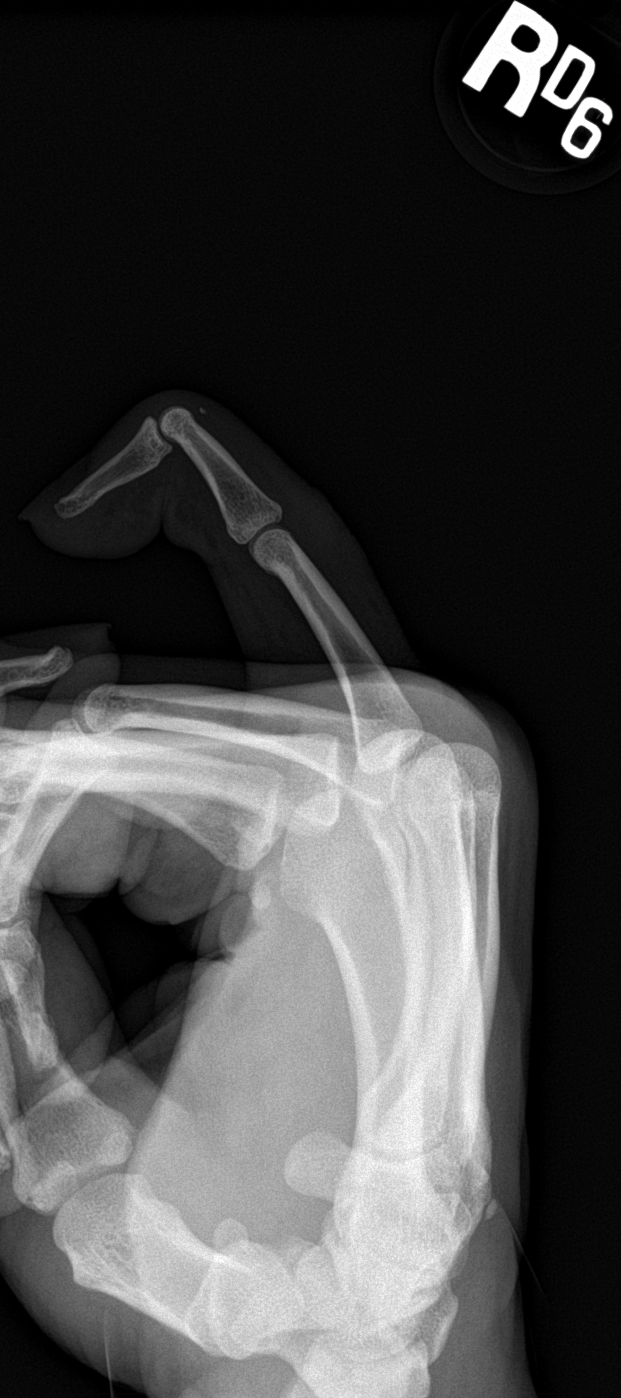

[3 of 3 positions shown; findings below may reference images not displayed]

FINDINGS: There is a punctate osseous fragment about the dorsal aspect of the
middle phalanx of the fifth digit. Findings are concerning for
avulsion fracture of the extensor tendon.
IMPRESSION: Punctate osseous fragment about the dorsal aspect of the middle
phalanx of the fifth digit concerning for avulsion fracture of the
extensor digitorum tendon resulting in persistent flexion at the
distal interphalangeal joint (mallet finger). The finding could also
be seen due to proximal tendon injury, clinical correlation is
suggested.
